# Patient Record
Sex: Male | Born: 1955 | Race: White | Hispanic: No | Marital: Married | Smoking: Never smoker
Health system: Southern US, Community
[De-identification: ages and names within clinical notes are randomized; demographics above are authoritative.]

## PROBLEM LIST (undated history)

## (undated) DIAGNOSIS — J189 Pneumonia, unspecified organism: Secondary | ICD-10-CM

## (undated) DIAGNOSIS — R0681 Apnea, not elsewhere classified: Secondary | ICD-10-CM

## (undated) DIAGNOSIS — T7840XA Allergy, unspecified, initial encounter: Secondary | ICD-10-CM

## (undated) DIAGNOSIS — T8859XA Other complications of anesthesia, initial encounter: Secondary | ICD-10-CM

## (undated) DIAGNOSIS — I341 Nonrheumatic mitral (valve) prolapse: Secondary | ICD-10-CM

## (undated) DIAGNOSIS — M1990 Unspecified osteoarthritis, unspecified site: Secondary | ICD-10-CM

## (undated) DIAGNOSIS — Z87442 Personal history of urinary calculi: Secondary | ICD-10-CM

## (undated) DIAGNOSIS — R011 Cardiac murmur, unspecified: Secondary | ICD-10-CM

## (undated) HISTORY — DX: Personal history of urinary calculi: Z87.442

## (undated) HISTORY — PX: RECTAL PROLAPSE REPAIR: SHX759

## (undated) HISTORY — DX: Cardiac murmur, unspecified: R01.1

## (undated) HISTORY — DX: Allergy, unspecified, initial encounter: T78.40XA

## (undated) HISTORY — PX: LEG SURGERY: SHX1003

## (undated) HISTORY — PX: HEMORROIDECTOMY: SUR656

## (undated) HISTORY — DX: Apnea, not elsewhere classified: R06.81

## (undated) HISTORY — PX: COLONOSCOPY: SHX174

---

## 2016-07-31 ENCOUNTER — Emergency Department (HOSPITAL_COMMUNITY): Payer: BLUE CROSS/BLUE SHIELD

## 2016-07-31 ENCOUNTER — Emergency Department (HOSPITAL_COMMUNITY)
Admission: EM | Admit: 2016-07-31 | Discharge: 2016-07-31 | Disposition: A | Discharge status: Home / Self Care | Payer: BLUE CROSS/BLUE SHIELD | Attending: Emergency Medicine | Admitting: Emergency Medicine

## 2016-07-31 ENCOUNTER — Encounter (HOSPITAL_COMMUNITY): Payer: Self-pay

## 2016-07-31 DIAGNOSIS — N200 Calculus of kidney: Secondary | ICD-10-CM | POA: Diagnosis not present

## 2016-07-31 DIAGNOSIS — R109 Unspecified abdominal pain: Secondary | ICD-10-CM | POA: Diagnosis present

## 2016-07-31 DIAGNOSIS — R10A2 Flank pain, left side: Secondary | ICD-10-CM

## 2016-07-31 LAB — URINALYSIS, ROUTINE W REFLEX MICROSCOPIC
BILIRUBIN URINE: NEGATIVE
Glucose, UA: NEGATIVE mg/dL
KETONES UR: NEGATIVE mg/dL
LEUKOCYTES UA: NEGATIVE
NITRITE: NEGATIVE
PH: 5.5 (ref 5.0–8.0)
PROTEIN: NEGATIVE mg/dL
Specific Gravity, Urine: 1.019 (ref 1.005–1.030)

## 2016-07-31 LAB — CBC WITH DIFFERENTIAL/PLATELET
BASOS PCT: 0 %
Basophils Absolute: 0 10*3/uL (ref 0.0–0.1)
EOS ABS: 0.2 10*3/uL (ref 0.0–0.7)
Eosinophils Relative: 2 %
HCT: 44.3 % (ref 39.0–52.0)
HEMOGLOBIN: 15.7 g/dL (ref 13.0–17.0)
Lymphocytes Relative: 54 %
Lymphs Abs: 3.9 10*3/uL (ref 0.7–4.0)
MCH: 31.4 pg (ref 26.0–34.0)
MCHC: 35.4 g/dL (ref 30.0–36.0)
MCV: 88.6 fL (ref 78.0–100.0)
MONOS PCT: 6 %
Monocytes Absolute: 0.5 10*3/uL (ref 0.1–1.0)
NEUTROS PCT: 38 %
Neutro Abs: 2.7 10*3/uL (ref 1.7–7.7)
Platelets: 155 10*3/uL (ref 150–400)
RBC: 5 MIL/uL (ref 4.22–5.81)
RDW: 12.9 % (ref 11.5–15.5)
WBC: 7.3 10*3/uL (ref 4.0–10.5)

## 2016-07-31 LAB — URINE MICROSCOPIC-ADD ON

## 2016-07-31 LAB — I-STAT CHEM 8, ED
BUN: 25 mg/dL — AB (ref 6–20)
Calcium, Ion: 1.22 mmol/L (ref 1.15–1.40)
Chloride: 104 mmol/L (ref 101–111)
Creatinine, Ser: 0.7 mg/dL (ref 0.61–1.24)
Glucose, Bld: 135 mg/dL — ABNORMAL HIGH (ref 65–99)
HEMATOCRIT: 45 % (ref 39.0–52.0)
Hemoglobin: 15.3 g/dL (ref 13.0–17.0)
Potassium: 3.7 mmol/L (ref 3.5–5.1)
SODIUM: 147 mmol/L — AB (ref 135–145)
TCO2: 30 mmol/L (ref 0–100)

## 2016-07-31 MED ORDER — TAMSULOSIN HCL 0.4 MG PO CAPS: 0.4000 mg | ORAL_CAPSULE | Freq: Every day | ORAL | 0 refills | Status: DC

## 2016-07-31 MED ORDER — METOCLOPRAMIDE HCL 5 MG/ML IJ SOLN
10.0000 mg | Freq: Once | INTRAMUSCULAR | Status: AC
  Administered 2016-07-31: 10 mg via INTRAVENOUS
  Filled 2016-07-31: qty 2

## 2016-07-31 MED ORDER — PROMETHAZINE HCL 25 MG/ML IJ SOLN
6.2500 mg | Freq: Once | INTRAMUSCULAR | Status: AC
  Administered 2016-07-31: 6.25 mg via INTRAVENOUS
  Filled 2016-07-31: qty 1

## 2016-07-31 MED ORDER — ONDANSETRON HCL 4 MG/2ML IJ SOLN
4.0000 mg | Freq: Once | INTRAMUSCULAR | Status: AC
  Administered 2016-07-31: 4 mg via INTRAVENOUS
  Filled 2016-07-31: qty 2

## 2016-07-31 MED ORDER — HYDROMORPHONE HCL 1 MG/ML IJ SOLN
1.0000 mg | Freq: Once | INTRAMUSCULAR | Status: AC
  Administered 2016-07-31: 1 mg via INTRAVENOUS
  Filled 2016-07-31: qty 1

## 2016-07-31 MED ORDER — ONDANSETRON 4 MG PO TBDP: 4.0000 mg | ORAL_TABLET | Freq: Three times a day (TID) | ORAL | 0 refills | Status: DC | PRN

## 2016-07-31 MED ORDER — KETOROLAC TROMETHAMINE 15 MG/ML IJ SOLN
30.0000 mg | Freq: Once | INTRAMUSCULAR | Status: AC
  Administered 2016-07-31: 30 mg via INTRAVENOUS
  Filled 2016-07-31: qty 2

## 2016-07-31 MED ORDER — MORPHINE SULFATE (PF) 4 MG/ML IV SOLN
4.0000 mg | Freq: Once | INTRAVENOUS | Status: AC
  Administered 2016-07-31: 4 mg via INTRAVENOUS
  Filled 2016-07-31: qty 1

## 2016-07-31 MED ORDER — FENTANYL CITRATE (PF) 100 MCG/2ML IJ SOLN
50.0000 ug | INTRAMUSCULAR | Status: AC | PRN
  Administered 2016-07-31 (×2): 50 ug via INTRAVENOUS
  Filled 2016-07-31 (×2): qty 2

## 2016-07-31 MED ORDER — KETOROLAC TROMETHAMINE 10 MG PO TABS: 10.0000 mg | ORAL_TABLET | Freq: Four times a day (QID) | ORAL | 0 refills | Status: DC | PRN

## 2016-07-31 MED ORDER — KETOROLAC TROMETHAMINE 15 MG/ML IJ SOLN: 15.0000 mg | Freq: Once | INTRAMUSCULAR | Status: DC

## 2016-07-31 MED ORDER — KETOROLAC TROMETHAMINE 30 MG/ML IJ SOLN
30.0000 mg | Freq: Once | INTRAMUSCULAR | Status: AC
  Administered 2016-07-31: 30 mg via INTRAVENOUS
  Filled 2016-07-31: qty 1

## 2016-07-31 NOTE — ED Notes (Signed)
Patient transported to CT 

## 2016-07-31 NOTE — ED Provider Notes (Signed)
[facility]-EMERGENCY DEPT Provider Note   CSN: [id number] Arrival date & time: 07/31/16  0301     History   Chief Complaint Chief Complaint  Patient presents with  . Flank Pain    HPI Randy Cruz is a 60 y.o. male.  Patient presents with sudden onset severe left flank pain with nausea and vomiting starting over night last night. The pain radiates to LLQ and left testicle.  No fever. No history of stones. He denies having similar symptoms in the past. No chest pain.    The history is provided by the patient and the spouse. No language interpreter was used.  Flank Pain  This is a new problem. The current episode started 1 to 2 hours ago. The problem occurs constantly. The problem has not changed since onset.Associated symptoms include abdominal pain. Pertinent negatives include no chest pain and no shortness of breath. Nothing aggravates the symptoms. Nothing relieves the symptoms.    History reviewed. No pertinent past medical history.  There are no active problems to display for this patient.   History reviewed. No pertinent surgical history.     Home Medications    Prior to Admission medications   Not on File    Family History History reviewed. No pertinent family history.  Social History Social History  Substance Use Topics  . Smoking status: Never Smoker  . Smokeless tobacco: Never Used  . Alcohol use No     Allergies   Demerol [meperidine]   Review of Systems Review of Systems  Constitutional: Negative for chills and fever.  Respiratory: Negative.  Negative for shortness of breath.   Cardiovascular: Negative.  Negative for chest pain.  Gastrointestinal: Positive for abdominal pain, nausea and vomiting.  Genitourinary: Positive for flank pain and testicular pain. Negative for difficulty urinating.  Skin: Negative.   Neurological: Negative.      Physical Exam Updated Vital Signs BP 132/84 (BP Location: Right Arm)   Pulse 82   Temp 97.8 F (36.6  C) (Oral)   Resp 18   Ht 5\' 9"  (1.753 m)   Wt 70.3 kg   SpO2 97%   BMI 22.89 kg/m   Physical Exam  Constitutional: He is oriented to person, place, and time. He appears well-developed and well-nourished.  Appears extremely uncomfortable, unable to sit still, actively vomiting.   Neck: Normal range of motion.  Pulmonary/Chest: Effort normal.  Abdominal: Soft. There is no tenderness.  Genitourinary:  Genitourinary Comments: Mild left CVA tenderness.   Musculoskeletal: Normal range of motion.  Neurological: He is alert and oriented to person, place, and time.  Skin: Skin is warm and dry.  Psychiatric: He has a normal mood and affect.     ED Treatments / Results  Labs (all labs ordered are listed, but only abnormal results are displayed) Labs Reviewed  CBC WITH DIFFERENTIAL/PLATELET  URINALYSIS, ROUTINE W REFLEX MICROSCOPIC (NOT [facility])  I-STAT CHEM 8, ED   Results for orders placed or performed during the hospital encounter of 07/31/16  CBC with Differential/Platelet  Result Value Ref Range   WBC 7.3 4.0 - 10.5 K/uL   RBC 5.00 4.22 - 5.81 MIL/uL   Hemoglobin 15.7 13.0 - 17.0 g/dL   HCT 40.944.3 81.139.0 - 91.452.0 %   MCV 88.6 78.0 - 100.0 fL   MCH 31.4 26.0 - 34.0 pg   MCHC 35.4 30.0 - 36.0 g/dL   RDW 78.212.9 95.611.5 - 21.315.5 %   Platelets 155 150 - 400 K/uL   Neutrophils Relative %  38 %   Neutro Abs 2.7 1.7 - 7.7 K/uL   Lymphocytes Relative 54 %   Lymphs Abs 3.9 0.7 - 4.0 K/uL   Monocytes Relative 6 %   Monocytes Absolute 0.5 0.1 - 1.0 K/uL   Eosinophils Relative 2 %   Eosinophils Absolute 0.2 0.0 - 0.7 K/uL   Basophils Relative 0 %   Basophils Absolute 0.0 0.0 - 0.1 K/uL   Ct Renal Stone Study  Result Date: 07/31/2016 CLINICAL DATA:  Awakened this morning by left flank pain and nausea. EXAM: CT ABDOMEN AND PELVIS WITHOUT CONTRAST TECHNIQUE: Multidetector CT imaging of the abdomen and pelvis was performed following the standard protocol without IV contrast. COMPARISON:  None  FINDINGS: There is an obstructing left ureteral calculus at the upper L4 level measuring 4 x 5 mm in cross-section and 7.5 mm in length. There is moderate hydronephrosis. No other urinary calculi are evident. No other acute findings are evident. There are unremarkable unenhanced appearances of the liver, gallbladder, bile ducts, pancreas, spleen, adrenals and right kidney. The urinary bladder is unremarkable. The stomach, small bowel and colon are unremarkable. The abdominal aorta is normal in caliber. There is no atherosclerotic calcification. There is no adenopathy in the abdomen or pelvis. No significant skeletal lesion. No significant abnormality in the lower chest. IMPRESSION: Obstructing 4 x 5 mm left ureteral calculus at the upper L4 level with moderate hydronephrosis. Electronically Signed   By: Ellery Plunk M.D.   On: 07/31/2016 04:53    EKG  EKG Interpretation None       Radiology Ct Renal Stone Study  Result Date: 07/31/2016 CLINICAL DATA:  Awakened this morning by left flank pain and nausea. EXAM: CT ABDOMEN AND PELVIS WITHOUT CONTRAST TECHNIQUE: Multidetector CT imaging of the abdomen and pelvis was performed following the standard protocol without IV contrast. COMPARISON:  None FINDINGS: There is an obstructing left ureteral calculus at the upper L4 level measuring 4 x 5 mm in cross-section and 7.5 mm in length. There is moderate hydronephrosis. No other urinary calculi are evident. No other acute findings are evident. There are unremarkable unenhanced appearances of the liver, gallbladder, bile ducts, pancreas, spleen, adrenals and right kidney. The urinary bladder is unremarkable. The stomach, small bowel and colon are unremarkable. The abdominal aorta is normal in caliber. There is no atherosclerotic calcification. There is no adenopathy in the abdomen or pelvis. No significant skeletal lesion. No significant abnormality in the lower chest. IMPRESSION: Obstructing 4 x 5 mm left  ureteral calculus at the upper L4 level with moderate hydronephrosis. Electronically Signed   By: Ellery Plunk M.D.   On: 07/31/2016 04:53    Procedures Procedures (including critical care time)  Medications Ordered in ED Medications  fentaNYL (SUBLIMAZE) injection 50 mcg (50 mcg Intravenous Given 07/31/16 0344)  ondansetron (ZOFRAN) injection 4 mg (4 mg Intravenous Given 07/31/16 0343)  metoCLOPramide (REGLAN) injection 10 mg (10 mg Intravenous Given 07/31/16 0419)  morphine 4 MG/ML injection 4 mg (4 mg Intravenous Given 07/31/16 0421)  HYDROmorphone (DILAUDID) injection 1 mg (1 mg Intravenous Given 07/31/16 0448)  ketorolac (TORADOL) 15 MG/ML injection 30 mg (30 mg Intravenous Given 07/31/16 0515)     Initial Impression / Assessment and Plan / ED Course  I have reviewed the triage vital signs and the nursing notes.  Pertinent labs & imaging results that were available during my care of the patient were reviewed by me and considered in my medical decision making (see chart for details).  Clinical Course    1. Left ureteral stone  The patient's pain is better controlled with Toradol rather than Morphine and Dilaudid given prior to Toradol. Nausea returned after PO challenge. Additional medication ordered.   Patient care signed out to [facility], PA-C, pending UA to r/o infection. PO challenge will need to be repeated given recurrent vomiting. If pain unmanaged, PO challenge fails, will likely need to consult Dr. Annabell Howells with urology.   Final Clinical Impressions(s) / ED Diagnoses   Final diagnoses:  None    New Prescriptions New Prescriptions   No medications on file     [place]ne [place] 07/31/16 1610    April Palumbo, MD 07/31/16 [phone]

## 2016-07-31 NOTE — ED Notes (Signed)
Pt still reporting pain 10/10 with no improvement in nausea. PA notified and order for Dilaudid 1mg  IVP given.

## 2016-07-31 NOTE — Discharge Instructions (Signed)
Follow up with urology for re-evaluation. Take zofran as needed for nausea. Take toradol as needed for pain. Do not exceed 40mg /day. Take Flomax daily. Return to the ED if you experience severe worsening of your symptoms, fevers, chills, continued vomiting, uncontrolled pain.

## 2016-07-31 NOTE — ED Notes (Signed)
Verbal order for Toradol 30mg  IVP by S.Upstill PA

## 2016-07-31 NOTE — ED Provider Notes (Signed)
Upon re-evaluation, pt still feeling nauseous. Given phenergan and additional toradol with significant improvement in symptoms. Pt now tolerating fluids in ED, >6oz. UA does not appear to bee infected, sent for culture. Will d/c with flomax, PO toradol per pt request and zofran. Referral to urology given. Return precautions outlined in patient discharge instructions.     Lester KinsmanSamantha Tripp PolandDowless, PA-C 07/31/16 1559    Cy BlamerApril Palumbo, MD 08/01/16 [phone]

## 2016-07-31 NOTE — ED Notes (Signed)
Pt having active vomiting at this time. PA notified.

## 2016-07-31 NOTE — ED Notes (Signed)
Pt given urine cup and instructed on need for urine sample 

## 2016-07-31 NOTE — ED Notes (Signed)
Pt currently drinking water.

## 2016-07-31 NOTE — ED Triage Notes (Signed)
Pt complains of left sided back pain that woke him up in the middle of the night, he also states that he's nauseated

## 2016-07-31 NOTE — ED Notes (Signed)
Verbal order for 10mg  Reglan IVP and 4mg  Morphine IVP by S.Upstill PA.

## 2016-08-01 ENCOUNTER — Inpatient Hospital Stay (HOSPITAL_COMMUNITY): Payer: BLUE CROSS/BLUE SHIELD | Admitting: Anesthesiology

## 2016-08-01 ENCOUNTER — Encounter (HOSPITAL_COMMUNITY): Payer: Self-pay | Admitting: *Deleted

## 2016-08-01 ENCOUNTER — Other Ambulatory Visit: Payer: Self-pay | Admitting: Urology

## 2016-08-01 ENCOUNTER — Ambulatory Visit (HOSPITAL_COMMUNITY)
Admission: AD | Admit: 2016-08-01 | Discharge: 2016-08-01 | Disposition: A | Discharge status: Home / Self Care | Payer: BLUE CROSS/BLUE SHIELD | Source: Ambulatory Visit | Attending: Urology | Admitting: Urology

## 2016-08-01 ENCOUNTER — Encounter (HOSPITAL_COMMUNITY)
Admission: AD | Disposition: A | Discharge status: Home / Self Care | Payer: Self-pay | Source: Ambulatory Visit | Attending: Urology

## 2016-08-01 DIAGNOSIS — N362 Urethral caruncle: Secondary | ICD-10-CM | POA: Insufficient documentation

## 2016-08-01 DIAGNOSIS — N201 Calculus of ureter: Secondary | ICD-10-CM | POA: Insufficient documentation

## 2016-08-01 DIAGNOSIS — N200 Calculus of kidney: Secondary | ICD-10-CM | POA: Diagnosis present

## 2016-08-01 HISTORY — DX: Unspecified osteoarthritis, unspecified site: M19.90

## 2016-08-01 HISTORY — DX: Pneumonia, unspecified organism: J18.9

## 2016-08-01 HISTORY — DX: Nonrheumatic mitral (valve) prolapse: I34.1

## 2016-08-01 HISTORY — PX: CYSTOSCOPY/RETROGRADE/URETEROSCOPY: SHX5316

## 2016-08-01 HISTORY — PX: HOLMIUM LASER APPLICATION: SHX5852

## 2016-08-01 LAB — URINE CULTURE: Culture: NO GROWTH

## 2016-08-01 SURGERY — CYSTOSCOPY/RETROGRADE/URETEROSCOPY
Anesthesia: General | Laterality: Left

## 2016-08-01 MED ORDER — FENTANYL CITRATE (PF) 100 MCG/2ML IJ SOLN
INTRAMUSCULAR | Status: DC | PRN
  Administered 2016-08-01 (×2): 50 ug via INTRAVENOUS

## 2016-08-01 MED ORDER — CEPHALEXIN 500 MG PO CAPS: 500.0000 mg | ORAL_CAPSULE | Freq: Two times a day (BID) | ORAL | 0 refills | Status: DC

## 2016-08-01 MED ORDER — HYDROMORPHONE HCL 1 MG/ML IJ SOLN: 0.2500 mg | INTRAMUSCULAR | Status: DC | PRN

## 2016-08-01 MED ORDER — PROPOFOL 10 MG/ML IV BOLUS
INTRAVENOUS | Status: AC
  Filled 2016-08-01: qty 20

## 2016-08-01 MED ORDER — OXYBUTYNIN CHLORIDE 5 MG PO TABS: 5.0000 mg | ORAL_TABLET | Freq: Three times a day (TID) | ORAL | 1 refills | Status: DC

## 2016-08-01 MED ORDER — FENTANYL CITRATE (PF) 100 MCG/2ML IJ SOLN
INTRAMUSCULAR | Status: AC
  Filled 2016-08-01: qty 2

## 2016-08-01 MED ORDER — CEFAZOLIN SODIUM-DEXTROSE 2-4 GM/100ML-% IV SOLN
2.0000 g | INTRAVENOUS | Status: AC
Start: 2016-08-01 — End: 2016-08-01
  Administered 2016-08-01: 2 g via INTRAVENOUS
  Filled 2016-08-01: qty 100

## 2016-08-01 MED ORDER — PROPOFOL 10 MG/ML IV BOLUS
INTRAVENOUS | Status: DC | PRN
  Administered 2016-08-01: 200 mg via INTRAVENOUS

## 2016-08-01 MED ORDER — MIDAZOLAM HCL 5 MG/5ML IJ SOLN
INTRAMUSCULAR | Status: DC | PRN
  Administered 2016-08-01: 1 mg via INTRAVENOUS

## 2016-08-01 MED ORDER — CEFAZOLIN SODIUM-DEXTROSE 2-4 GM/100ML-% IV SOLN
INTRAVENOUS | Status: AC
  Filled 2016-08-01: qty 100

## 2016-08-01 MED ORDER — MIDAZOLAM HCL 2 MG/2ML IJ SOLN
INTRAMUSCULAR | Status: AC
  Filled 2016-08-01: qty 2

## 2016-08-01 MED ORDER — DEXAMETHASONE SODIUM PHOSPHATE 10 MG/ML IJ SOLN
INTRAMUSCULAR | Status: DC | PRN
  Administered 2016-08-01: 10 mg via INTRAVENOUS

## 2016-08-01 MED ORDER — PROMETHAZINE HCL 25 MG/ML IJ SOLN: 6.2500 mg | INTRAMUSCULAR | Status: DC | PRN

## 2016-08-01 MED ORDER — ONDANSETRON HCL 4 MG/2ML IJ SOLN
INTRAMUSCULAR | Status: AC
  Filled 2016-08-01: qty 2

## 2016-08-01 MED ORDER — BELLADONNA ALKALOIDS-OPIUM 16.2-60 MG RE SUPP
RECTAL | Status: DC | PRN
  Administered 2016-08-01: 1 via RECTAL

## 2016-08-01 MED ORDER — LIDOCAINE HCL (CARDIAC) 20 MG/ML IV SOLN
INTRAVENOUS | Status: DC | PRN
  Administered 2016-08-01: 25 mg via INTRATRACHEAL
  Administered 2016-08-01: 75 mg via INTRAVENOUS

## 2016-08-01 MED ORDER — STERILE WATER FOR IRRIGATION IR SOLN
Status: DC | PRN
  Administered 2016-08-01: 1000 mL

## 2016-08-01 MED ORDER — ACETAMINOPHEN 10 MG/ML IV SOLN
INTRAVENOUS | Status: AC
  Filled 2016-08-01: qty 100

## 2016-08-01 MED ORDER — MEPERIDINE HCL 50 MG/ML IJ SOLN: 6.2500 mg | INTRAMUSCULAR | Status: DC | PRN

## 2016-08-01 MED ORDER — ACETAMINOPHEN 10 MG/ML IV SOLN
INTRAVENOUS | Status: DC | PRN
  Administered 2016-08-01: 1000 mg via INTRAVENOUS

## 2016-08-01 MED ORDER — IOHEXOL 300 MG/ML  SOLN
INTRAMUSCULAR | Status: DC | PRN
  Administered 2016-08-01: 20 mL via URETHRAL

## 2016-08-01 MED ORDER — SODIUM CHLORIDE 0.9 % IR SOLN
Status: DC | PRN
  Administered 2016-08-01: 3000 mL

## 2016-08-01 MED ORDER — ONDANSETRON HCL 4 MG/2ML IJ SOLN
INTRAMUSCULAR | Status: DC | PRN
  Administered 2016-08-01: 4 mg via INTRAVENOUS

## 2016-08-01 MED ORDER — DEXAMETHASONE SODIUM PHOSPHATE 10 MG/ML IJ SOLN
INTRAMUSCULAR | Status: AC
  Filled 2016-08-01: qty 1

## 2016-08-01 MED ORDER — PROPOFOL 500 MG/50ML IV EMUL
INTRAVENOUS | Status: DC | PRN
  Administered 2016-08-01: 125 ug/kg/min via INTRAVENOUS

## 2016-08-01 MED ORDER — BELLADONNA ALKALOIDS-OPIUM 16.2-60 MG RE SUPP
RECTAL | Status: AC
  Filled 2016-08-01: qty 1

## 2016-08-01 MED ORDER — LACTATED RINGERS IV SOLN
INTRAVENOUS | Status: DC | PRN
  Administered 2016-08-01 (×2): via INTRAVENOUS

## 2016-08-01 MED ORDER — SUCCINYLCHOLINE CHLORIDE 20 MG/ML IJ SOLN
INTRAMUSCULAR | Status: DC | PRN
  Administered 2016-08-01: 100 mg via INTRAVENOUS

## 2016-08-01 MED ORDER — LACTATED RINGERS IV SOLN: INTRAVENOUS | Status: DC

## 2016-08-01 SURGICAL SUPPLY — 22 items
BAG URO CATCHER STRL LF (MISCELLANEOUS) ×3 IMPLANT
BASKET LASER NITINOL 1.9FR (BASKET) IMPLANT
BASKET ZERO TIP NITINOL 2.4FR (BASKET) IMPLANT
CATH INTERMIT  6FR 70CM (CATHETERS) ×3 IMPLANT
CLOTH BEACON ORANGE TIMEOUT ST (SAFETY) ×3 IMPLANT
EXTRACTOR STONE NITINOL NGAGE (UROLOGICAL SUPPLIES) ×3 IMPLANT
FIBER LASER FLEXIVA 365 (UROLOGICAL SUPPLIES) ×3 IMPLANT
FIBER LASER TRAC TIP (UROLOGICAL SUPPLIES) IMPLANT
GLOVE BIOGEL M 8.0 STRL (GLOVE) ×9 IMPLANT
GOWN STRL REUS W/ TWL XL LVL3 (GOWN DISPOSABLE) IMPLANT
GOWN STRL REUS W/TWL LRG LVL3 (GOWN DISPOSABLE) ×6 IMPLANT
GOWN STRL REUS W/TWL XL LVL3 (GOWN DISPOSABLE)
GUIDEWIRE ANG ZIPWIRE 038X150 (WIRE) IMPLANT
GUIDEWIRE STR DUAL SENSOR (WIRE) ×3 IMPLANT
IV NS 1000ML (IV SOLUTION)
IV NS 1000ML BAXH (IV SOLUTION) IMPLANT
MANIFOLD NEPTUNE II (INSTRUMENTS) ×3 IMPLANT
PACK CYSTO (CUSTOM PROCEDURE TRAY) ×3 IMPLANT
SHEATH ACCESS URETERAL 38CM (SHEATH) ×3 IMPLANT
STENT CONTOUR 6FRX24X.038 (STENTS) ×3 IMPLANT
TUBING CONNECTING 10 (TUBING) ×2 IMPLANT
TUBING CONNECTING 10' (TUBING) ×1

## 2016-08-01 NOTE — Op Note (Signed)
Preoperative diagnosis: History of gross hematuria, left proximal ureteral stone  Postoperative diagnosis: History of gross hematuria, anterior urethral lesion/polyp, left ureteral stone, normal retrograde ureteropyelograms.  Principal procedure: Cystoscopy, biopsy of urethral lesion, bilateral retrograde ureteropyelograms with fluoroscopic interpretation, dilation of left ureter, left ureteroscopy with holmium laser lithotripsy and extraction of left ureteral stone, placement of 24 centimeter by 6 French contour double-J stent.  Surgeon: Retta Dionesahlstedt  Anesthesia: Gen. endotracheal  Complications: None  Specimen: Stone fragments, to the patient's wife  Drains: 24 centimeter by 6 French contour double-J stent with tether  Findings: 1 millimeter by 4 millimeter anterior urethral polyp, normal bladder, right retrograde ureteropyelogram revealed a normal right ureter without evidence of filling defects or hydronephrosis, normal pyelocalyceal system.  Left ureter and pyelocalyceal system normal except for a filling defect from left ureteral stone as well as forniceal extravasation of contrast.  Indications: 60 year old male with intractable pain from a proximal left ureteral stone.  The patient presented initially about 2 days ago to the emergency room, where his 4 by 5 millimeter left ureteral calculus was noted.  He was sent home after adequate pain management.  He presented for follow-up yesterday to see Dr. Marlou PorchHerrick.  At that time, he was fairly comfortable.  It was recommended that he undergo lithotripsy, which is scheduled for next week.  However, he presented today in and intractable pain, and despite Toradol, was significantly uncomfortable.  There was no evidence of stone progression.  I offered the patient continued pain management versus ureteroscopic management of his stone.  He has chosen the latter.  Risks and complications of this procedure have been discussed in depth with the patient and  his wife who desire to proceed.  Description of procedure: The patient was properly identified and marked in the holding area.  He was taken the operating room.  General endotracheal anesthetic was administered.  He was then placed in the dorsolithotomy position.  Genitalia and perineum were prepped and draped.  Proper timeout was performed.  His urethral meatus was dilated to 24 [place], as it would not admit the 21 French cystoscope.  Following this, the cystoscope was easily passed.  The urethra was normal except for the previously mentioned lesion on the right side of the mid anterior urethra.  It was felt this could be biopsied later, as he did have a history of hematuria, although doubtful from this lesion.  The scope was advanced through his prostatic urethra which was unremarkable.  The bladder was inspected circumferentially.  There are no tumors, trabeculations or foreign bodies.  Ureteral orifices were normal in configuration, but the location was fairly low down at the bladder neck.  The bladder showed no signs of lesions that would create hematuria.  The right ureter was cannulated with a 6 [place] open-ended catheter.  Retrograde ureteropyelogram was performed, with the above tension findings.  The left ureter was then cannulated with a 6 [place] open-ended catheter.  Again, retrograde pyelogram was performed.  This showed the obstructing left ureteral stone with slight proximal hydro-ureter nephrosis.  There was forniceal extravasation of the contrast, despite gentle administration of this contrast.  The sural 0.038 inch sensor-tip guidewire was advanced through the open-ended catheter, and up to the left upper pole calyces.  The open-ended catheter and the cystoscope were removed.  The ureter was sequentially dilated, first with the inner core and then with the entire 12/14 ureteral access catheter.  The access catheter was removed, the guidewire was left in place.  I  gently navigated the 6  French semirigid ureteroscope through the urethra, and then up into the bladder where the ureteral orifice was encountered.  I then passed the ureteroscope directly through the ureter and up into the stone.  It was felt that the stone was too large for extraction alone.  I then passed the 360 micron laser fiber through the scope, and applied laser energy at a power of 0.5 joules and a rate of 15 hertz.  This fragmented the stone into approximate 6-7 smaller fragments which were then easily grasped with the engage basket and extracted into the bladder.  There were then dropped into the bladder.  The scope was then passed through the ureter, up through the proximal ureter to the UPJ.  No further fragments were seen upon close inspection.  The scope was then removed.  I then backloaded the guidewire through the scope, and passed a 24 centimeter by 6 [place] contour double-J stent, with the string left intact, up through the ureter, positioning it adequately using fluoroscopic and cystoscopic guidance.  Excellent proximal and distal curls were seen.  Once the guidewire was removed.  The string was left on and brought through the urethra.  I then placed.  The cold cup biopsy forceps on the scope, and biopsy the small urethral polyp.  This was then cauterized.  The specimen was sent to pathology labeled "urethral lesion,".  There was no significant bleeding from the biopsy site.  At this point, the bladder was drained.  The scope was removed.  The string was taped to the patient's penis.  After it was shortened somewhat.  The patient was then awakened and taken to PACU in stable condition.  He tolerated the procedure well.

## 2016-08-01 NOTE — Anesthesia Preprocedure Evaluation (Addendum)
Anesthesia Evaluation  Patient identified by MRN, date of birth, ID band Patient awake    Reviewed: Allergy & Precautions, NPO status , Patient's Chart, lab work & pertinent test results  Airway Mallampati: III   Neck ROM: Full  Mouth opening: Limited Mouth Opening  Dental  (+) Teeth Intact, Dental Advisory Given   Pulmonary neg pulmonary ROS,    breath sounds clear to auscultation       Cardiovascular negative cardio ROS   Rhythm:Regular Rate:Normal     Neuro/Psych negative neurological ROS  negative psych ROS   GI/Hepatic negative GI ROS, Neg liver ROS,   Endo/Other  negative endocrine ROS  Renal/GU negative Renal ROS  negative genitourinary   Musculoskeletal negative musculoskeletal ROS (+)   Abdominal (+)  Abdomen: tender.    Peds negative pediatric ROS (+)  Hematology negative hematology ROS (+)   Anesthesia Other Findings   Reproductive/Obstetrics negative OB ROS                            Anesthesia Physical Anesthesia Plan  ASA: II and emergent  Anesthesia Plan: General   Post-op Pain Management:    Induction: Intravenous, Rapid sequence and Cricoid pressure planned  Airway Management Planned: Oral ETT and Video Laryngoscope Planned  Additional Equipment:   Intra-op Plan:   Post-operative Plan: Extubation in OR  Informed Consent: I have reviewed the patients History and Physical, chart, labs and discussed the procedure including the risks, benefits and alternatives for the proposed anesthesia with the patient or authorized representative who has indicated his/her understanding and acceptance.     Plan Discussed with: CRNA  Anesthesia Plan Comments:         Anesthesia Quick Evaluation

## 2016-08-01 NOTE — Discharge Instructions (Signed)

## 2016-08-01 NOTE — Transfer of Care (Signed)
Immediate Anesthesia Transfer of Care Note  Patient: Randy Cruz  Procedure(s) Performed: Procedure(s): CYSTO/BILATERA L RETROGRADELEFT /URETEROSCOPY/STONE REMOVAL WITH BASKET/LEFT URETERAL STENT /URETRHAL BIOPSY (Left) HOLMIUM LASER APPLICATION (Left)  Patient Location: PACU  Anesthesia Type:General  Level of Consciousness: awake, alert , oriented and patient cooperative  Airway & Oxygen Therapy: Patient Spontanous Breathing and Patient connected to face mask oxygen  Post-op Assessment: Report given to RN, Post -op Vital signs reviewed and stable and Patient moving all extremities X 4  Post vital signs: stable  Last Vitals:  Vitals:   08/01/16 2126 08/01/16 2130  BP: 115/75   Pulse: 67 73  Resp: 14 14  Temp: 36.7 C     Last Pain:  Vitals:   08/01/16 1837  PainSc: 5       Patients Stated Pain Goal: 3 (08/01/16 1837)  Complications: No apparent anesthesia complications

## 2016-08-01 NOTE — Anesthesia Procedure Notes (Signed)
Procedure Name: Intubation Date/Time: 08/01/2016 8:17 PM Performed by: Illene SilverEVANS, Georgeann Brinkman E Pre-anesthesia Checklist: Patient identified, Emergency Drugs available, Suction available and Patient being monitored Patient Re-evaluated:Patient Re-evaluated prior to inductionOxygen Delivery Method: Circle system utilized Preoxygenation: Pre-oxygenation with 100% oxygen Intubation Type: IV induction Ventilation: Mask ventilation without difficulty Grade View: Grade III Tube type: Oral Tube size: 7.5 mm Number of attempts: 1 Airway Equipment and Method: Stylet and Oral airway Placement Confirmation: ETT inserted through vocal cords under direct vision,  positive ETCO2 and breath sounds checked- equal and bilateral Secured at: 22 cm Tube secured with: Tape Dental Injury: Teeth and Oropharynx as per pre-operative assessment  Difficulty Due To: Difficulty was anticipated, Difficult Airway- due to anterior larynx and Difficult Airway- due to limited oral opening Future Recommendations: Recommend- induction with short-acting agent, and alternative techniques readily available Comments: Very small mouth , anterior larynx

## 2016-08-01 NOTE — H&P (Signed)
Urology History and Physical Exam  CC: kidney stone  HPI: 60 year old male presented to our office today, but the second time in 24 hours with a symptomatic left proximal ureteral stone.  He presented to the emergency room about 36 hours ago with similar left flank pain, nausea and vomiting.  The patient was made comfortable, although he did have a problem with fentanyl a reaction to that.  He began having significant flank pain at 3 PM this afternoon.  That included nausea.  The patient presented to the office in significant pain.  He was given Toradol.  The patient had his pain resolved moderately well, and KUB revealed persistence of his left proximal ureteral stone at about the L4 transverse process.  Because of the pain, its significant recurrence, as well as relative nonprogression of this small-to-moderate stone, it was recommended that he either increase his narcotic use or consider ureteroscopy today to ameliorate his symptoms.  The patient has decided to proceed withureteroscopy.  PMH: No past medical history on file.  PSH: No past surgical history on file.  Allergies: Allergies  Allergen Reactions  . Demerol [Meperidine] Nausea And Vomiting    Medications: No prescriptions prior to admission.     Social History: Social History   Social History  . Marital status: Married    Spouse name: N/A  . Number of children: N/A  . Years of education: N/A   Occupational History  . Not on file.   Social History Main Topics  . Smoking status: Never Smoker  . Smokeless tobacco: Never Used  . Alcohol use No  . Drug use: Unknown  . Sexual activity: Not on file   Other Topics Concern  . Not on file   Social History Narrative  . No narrative on file    Family History: No family history on file.  Review of Systems: Positive:  Intermittent gross hematuria for several years, left flank pain, nausea Negative:  A further 10 point review of systems was negative except what is  listed in the HPI.                  Physical Exam: @VITALS2 @ General: Originally in moderate to severe distress.  Awake. Head:  Normocephalic.  Atraumatic. ENT:  EOMI.  Mucous membranes moist Neck:  Supple.  No lymphadenopathy. CV:  S1 present. S2 present. Regular rate. Pulmonary: Equal effort bilaterally.  Clear to auscultation bilaterally. Abdomen: Soft.  Left CVA tenderness Skin:  Normal turgor.  No visible rash. Extremity: No gross deformity of bilateral upper extremities.  No gross deformity of                             lower extremities. Neurologic: Alert. Appropriate mood.    Studies:  Recent Labs     07/31/16  0335  07/31/16  0355  HGB  15.7  15.3  WBC  7.3   --   PLT  155   --     Recent Labs     07/31/16  0355  NA  147*  K  3.7  CL  104  BUN  25*  CREATININE  0.70   the patient had a KUB today.  This reveals a 4 mm calcification of the left L4 transverse process.  We reviewed his prior CT images.  No results for input(s): INR, APTT in the last 72 hours.  Invalid input(s): PT   Invalid input(s): ABG  Assessment:  Significantly symptomatic, not progressing left proximal ureteral stone  Plan: The patient would like to proceed with left ureteroscopy and laser of the stone.  He is aware of the risk and complications have been discussed with him.  He is also aware of the option of considering/continuing medical expulsive therapy in preparation for lithotripsy next week.  He and his wife desire to proceed.  He has had a sandwich about 12:30 PM, as well as, about 3 PM, half of an [place] ice.

## 2016-08-01 NOTE — Anesthesia Postprocedure Evaluation (Signed)
Anesthesia Post Note  Patient: Randy Cruz  Procedure(s) Performed: Procedure(s) (LRB): CYSTO/BILATERA L RETROGRADELEFT /URETEROSCOPY/STONE REMOVAL WITH BASKET/LEFT URETERAL STENT /URETRHAL BIOPSY (Left) HOLMIUM LASER APPLICATION (Left)  Patient location during evaluation: PACU Anesthesia Type: General Level of consciousness: awake and alert Pain management: pain level controlled Vital Signs Assessment: post-procedure vital signs reviewed and stable Respiratory status: spontaneous breathing, nonlabored ventilation, respiratory function stable and patient connected to nasal cannula oxygen Cardiovascular status: blood pressure returned to baseline and stable Postop Assessment: no signs of nausea or vomiting Anesthetic complications: no    Last Vitals:  Vitals:   08/01/16 2126 08/01/16 2130  BP: 115/75   Pulse: 67 73  Resp: 14 14  Temp: 36.7 C     Last Pain:  Vitals:   08/01/16 1837  PainSc: 5                  Shelton SilvasKevin D Odette Watanabe

## 2016-08-02 ENCOUNTER — Encounter (HOSPITAL_COMMUNITY): Payer: Self-pay | Admitting: Urology

## 2016-08-07 ENCOUNTER — Encounter (HOSPITAL_COMMUNITY): Payer: Self-pay | Admitting: Urology

## 2016-08-09 ENCOUNTER — Ambulatory Visit (HOSPITAL_COMMUNITY): Admit: 2016-08-09 | Payer: BLUE CROSS/BLUE SHIELD | Admitting: Urology

## 2016-08-09 ENCOUNTER — Encounter (HOSPITAL_COMMUNITY): Payer: Self-pay

## 2016-08-09 SURGERY — LITHOTRIPSY, ESWL
Anesthesia: LOCAL | Laterality: Left

## 2017-01-01 ENCOUNTER — Other Ambulatory Visit: Payer: Self-pay | Admitting: Registered Nurse

## 2017-01-01 ENCOUNTER — Ambulatory Visit
Admission: RE | Admit: 2017-01-01 | Discharge: 2017-01-01 | Disposition: A | Discharge status: Home / Self Care | Payer: BLUE CROSS/BLUE SHIELD | Source: Ambulatory Visit | Attending: Registered Nurse | Admitting: Registered Nurse

## 2017-01-01 DIAGNOSIS — M25562 Pain in left knee: Secondary | ICD-10-CM | POA: Diagnosis not present

## 2017-01-01 DIAGNOSIS — M25561 Pain in right knee: Secondary | ICD-10-CM | POA: Insufficient documentation

## 2017-01-01 DIAGNOSIS — R52 Pain, unspecified: Secondary | ICD-10-CM

## 2017-01-09 DIAGNOSIS — M2042 Other hammer toe(s) (acquired), left foot: Secondary | ICD-10-CM | POA: Diagnosis not present

## 2017-01-09 DIAGNOSIS — M6740 Ganglion, unspecified site: Secondary | ICD-10-CM | POA: Diagnosis not present

## 2017-03-19 ENCOUNTER — Ambulatory Visit: Payer: Self-pay | Admitting: Medical

## 2017-03-19 ENCOUNTER — Encounter: Payer: Self-pay | Admitting: Medical

## 2017-03-19 VITALS — BP 125/80 | HR 61 | Temp 96.9°F | Resp 16 | Ht 69.0 in | Wt 165.0 lb

## 2017-03-19 DIAGNOSIS — M79675 Pain in left toe(s): Secondary | ICD-10-CM
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
## 2017-04-01 ENCOUNTER — Ambulatory Visit: Payer: BLUE CROSS/BLUE SHIELD | Admitting: Podiatry

## 2017-04-02 ENCOUNTER — Encounter: Payer: Self-pay | Admitting: Podiatry

## 2017-04-02 ENCOUNTER — Ambulatory Visit (INDEPENDENT_AMBULATORY_CARE_PROVIDER_SITE_OTHER): Payer: BLUE CROSS/BLUE SHIELD | Admitting: Podiatry

## 2017-04-02 ENCOUNTER — Ambulatory Visit (INDEPENDENT_AMBULATORY_CARE_PROVIDER_SITE_OTHER): Payer: BLUE CROSS/BLUE SHIELD

## 2017-04-02 DIAGNOSIS — M2042 Other hammer toe(s) (acquired), left foot: Secondary | ICD-10-CM | POA: Diagnosis not present

## 2017-04-02 DIAGNOSIS — M205X2 Other deformities of toe(s) (acquired), left foot: Secondary | ICD-10-CM

## 2017-04-02 DIAGNOSIS — M6740 Ganglion, unspecified site: Secondary | ICD-10-CM
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
## 2017-04-09 ENCOUNTER — Other Ambulatory Visit: Payer: BLUE CROSS/BLUE SHIELD | Admitting: *Deleted

## 2017-04-09 DIAGNOSIS — Z0000 Encounter for general adult medical examination without abnormal findings: Secondary | ICD-10-CM

## 2017-04-09 LAB — POCT URINALYSIS DIPSTICK
BILIRUBIN UA: NEGATIVE
Blood, UA: NEGATIVE
Glucose, UA: NEGATIVE
KETONES UA: NEGATIVE
LEUKOCYTES UA: NEGATIVE
Nitrite, UA: NEGATIVE
PH UA: 6 (ref 5.0–8.0)
PROTEIN UA: NEGATIVE
Spec Grav, UA: 1.025 (ref 1.010–1.025)
Urobilinogen, UA: 0.2 E.U./dL

## 2017-04-10 LAB — CMP12+LP+TP+TSH+6AC+PSA+CBC…
A/G RATIO: 2 (ref 1.2–2.2)
ALBUMIN: 4.5 g/dL (ref 3.5–5.5)
ALT: 22 IU/L (ref 0–44)
AST: 24 IU/L (ref 0–40)
Alkaline Phosphatase: 69 IU/L (ref 39–117)
BASOS: 0 %
BILIRUBIN TOTAL: 2.1 mg/dL — AB (ref 0.0–1.2)
BUN / CREAT RATIO: 25 — AB (ref 9–20)
BUN: 19 mg/dL (ref 6–24)
Basophils Absolute: 0 10*3/uL (ref 0.0–0.2)
CALCIUM: 8.7 mg/dL (ref 8.7–10.2)
CHLORIDE: 105 mmol/L (ref 96–106)
CREATININE: 0.77 mg/dL (ref 0.76–1.27)
Chol/HDL Ratio: 3.5 ratio (ref 0.0–5.0)
Cholesterol, Total: 190 mg/dL (ref 100–199)
EOS (ABSOLUTE): 0.2 10*3/uL (ref 0.0–0.4)
EOS: 4 %
Estimated CHD Risk: 0.5 times avg. (ref 0.0–1.0)
Free Thyroxine Index: 1.9 (ref 1.2–4.9)
GFR, EST AFRICAN AMERICAN: 116 mL/min/{1.73_m2} (ref >59)
GFR, EST NON AFRICAN AMERICAN: 100 mL/min/{1.73_m2} (ref >59)
GGT: 32 IU/L (ref 0–65)
Globulin, Total: 2.2 g/dL (ref 1.5–4.5)
Glucose: 122 mg/dL — ABNORMAL HIGH (ref 65–99)
HDL: 55 mg/dL (ref >39)
HEMOGLOBIN: 15.5 g/dL (ref 13.0–17.7)
Hematocrit: 45.5 % (ref 37.5–51.0)
IMMATURE GRANS (ABS): 0 10*3/uL (ref 0.0–0.1)
Immature Granulocytes: 0 %
Iron: 104 ug/dL (ref 38–169)
LDH: 162 IU/L (ref 121–224)
LDL Calculated: 120 mg/dL — ABNORMAL HIGH (ref 0–99)
LYMPHS: 46 %
Lymphocytes Absolute: 2 10*3/uL (ref 0.7–3.1)
MCH: 30.6 pg (ref 26.6–33.0)
MCHC: 34.1 g/dL (ref 31.5–35.7)
MCV: 90 fL (ref 79–97)
MONOCYTES: 7 %
Monocytes Absolute: 0.3 10*3/uL (ref 0.1–0.9)
Neutrophils Absolute: 1.9 10*3/uL (ref 1.4–7.0)
Neutrophils: 43 %
PHOSPHORUS: 2.6 mg/dL (ref 2.5–4.5)
POTASSIUM: 4.6 mmol/L (ref 3.5–5.2)
Platelets: 132 10*3/uL — ABNORMAL LOW (ref 150–379)
Prostate Specific Ag, Serum: 2.9 ng/mL (ref 0.0–4.0)
RBC: 5.07 x10E6/uL (ref 4.14–5.80)
RDW: 14.2 % (ref 12.3–15.4)
SODIUM: 145 mmol/L — AB (ref 134–144)
T3 Uptake Ratio: 29 % (ref 24–39)
T4, Total: 6.7 ug/dL (ref 4.5–12.0)
TOTAL PROTEIN: 6.7 g/dL (ref 6.0–8.5)
TSH: 4.21 u[IU]/mL (ref 0.450–4.500)
Triglycerides: 73 mg/dL (ref 0–149)
URIC ACID: 6.2 mg/dL (ref 3.7–8.6)
VLDL CHOLESTEROL CAL: 15 mg/dL (ref 5–40)
WBC: 4.4 10*3/uL (ref 3.4–10.8)

## 2017-04-10 LAB — VITAMIN D 25 HYDROXY (VIT D DEFICIENCY, FRACTURES): Vit D, 25-Hydroxy: 44.4 ng/mL (ref 30.0–100.0)

## 2017-04-11 LAB — SPECIMEN STATUS REPORT

## 2017-04-11 LAB — HGB A1C W/O EAG: Hgb A1c MFr Bld: 5.7 % — ABNORMAL HIGH (ref 4.8–5.6)

## 2017-04-17 ENCOUNTER — Ambulatory Visit: Payer: BLUE CROSS/BLUE SHIELD | Admitting: Medical

## 2017-04-17 DIAGNOSIS — R7309 Other abnormal glucose: Secondary | ICD-10-CM

## 2017-04-17 DIAGNOSIS — B351 Tinea unguium: Secondary | ICD-10-CM

## 2017-04-17 DIAGNOSIS — I341 Nonrheumatic mitral (valve) prolapse: Secondary | ICD-10-CM

## 2017-04-17 DIAGNOSIS — E7800 Pure hypercholesterolemia, unspecified: Secondary | ICD-10-CM

## 2017-04-17 DIAGNOSIS — Z0000 Encounter for general adult medical examination without abnormal findings: Secondary | ICD-10-CM

## 2017-04-17 DIAGNOSIS — R011 Cardiac murmur, unspecified: Secondary | ICD-10-CM

## 2017-04-17 DIAGNOSIS — R7303 Prediabetes: Secondary | ICD-10-CM
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
## 2017-05-07 ENCOUNTER — Encounter: Payer: Self-pay | Admitting: Medical

## 2017-05-07 ENCOUNTER — Ambulatory Visit: Payer: BLUE CROSS/BLUE SHIELD | Admitting: Medical

## 2017-05-07 VITALS — BP 150/80 | HR 79 | Temp 97.9°F | Resp 16

## 2017-05-07 DIAGNOSIS — K0889 Other specified disorders of teeth and supporting structures: Secondary | ICD-10-CM

## 2017-05-07 MED ORDER — AMOXICILLIN 875 MG PO TABS: 875.0000 mg | ORAL_TABLET | Freq: Two times a day (BID) | ORAL | 0 refills | Status: DC

## 2017-05-07 NOTE — Progress Notes (Signed)
   Subjective:    Patient ID: Randy Cruz, male    DOB: Nov 08, 1956, 61 y.o.   MRN: [id number]  HPI 61 yo male 4 day history of  discharge from upper left molar and painful if chewing or not chewing. Taking nothing for pain. Denies swelling of face or redness to face. No fevers. Complains  of a bad discharge coming from area.   Review of Systems  Constitutional: Negative for chills and fatigue.  HENT: Positive for dental problem. Negative for rhinorrhea and sore throat.   Eyes: Negative for discharge and itching.  Respiratory: Negative for cough and shortness of breath.   Cardiovascular: Negative for chest pain.  Gastrointestinal: Negative for diarrhea, nausea and vomiting.  Endocrine: Negative for polydipsia, polyphagia and polyuria.  Genitourinary: Negative for hematuria.  Musculoskeletal: Negative for myalgias.  Skin: Negative for rash.  Allergic/Immunologic: Positive for environmental allergies. Negative for food allergies.  Neurological: Negative for dizziness and syncope.  Hematological: Negative for adenopathy.  Psychiatric/Behavioral: Negative for confusion and hallucinations.       Objective:   Physical Exam  Constitutional: He is oriented to person, place, and time. He appears well-developed and well-nourished.  HENT:  Head: Normocephalic and atraumatic.  Right Ear: External ear normal.  Left Ear: External ear normal.  Nose: Nose normal.  Mouth/Throat: Oropharynx is clear and moist.  Eyes: EOM are normal. Pupils are equal, round, and reactive to light.  Neck: Normal range of motion. Neck supple.  Cardiovascular: Normal rate and regular rhythm.   Murmur heard. Pulmonary/Chest: Effort normal and breath sounds normal.  Musculoskeletal: Normal range of motion.  Lymphadenopathy:    He has no cervical adenopathy.  Neurological: He is alert and oriented to person, place, and time.  Skin: Skin is warm and dry.  Psychiatric: He has a normal mood and affect. His behavior  is normal.  Nursing note and vitals reviewed.    No facial swelling , no facial redness. No abcess noted around molar on gumline     Assessment & Plan:  Toothache left upper molar  E-prescribed Amoxil 875 mg  One tablet  by mouth twice a a day times seven days, take with food. #14 no refills.  Avoid chewing on left side to avoid pain.  OTC Motrin or Tylenol as needed for pain, take as directed. Return to the clinic if redness, increased pain or swelling  to the face occur.

## 2017-05-07 NOTE — Patient Instructions (Addendum)
Toothache left upper molar  E-prescribed Amoxil 875 mg  One tablet  by mouth twice a a day x 7 days take with food #14 no refills.  Avoid chewing on left side to avoid pain.  OTC Motrin or Tylenol as needed for pain, take as directed. Return to the clinic if redness, increased pain or swelling  to the face occur.
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
## 2017-05-22 ENCOUNTER — Ambulatory Visit (INDEPENDENT_AMBULATORY_CARE_PROVIDER_SITE_OTHER): Payer: BLUE CROSS/BLUE SHIELD | Admitting: Cardiovascular Disease

## 2017-05-22 ENCOUNTER — Encounter: Payer: Self-pay | Admitting: Cardiovascular Disease

## 2017-05-22 VITALS — BP 148/86 | HR 60 | Ht 69.0 in | Wt 157.0 lb

## 2017-05-22 DIAGNOSIS — R011 Cardiac murmur, unspecified: Secondary | ICD-10-CM

## 2017-05-22 DIAGNOSIS — Z9889 Other specified postprocedural states: Secondary | ICD-10-CM | POA: Diagnosis not present

## 2017-05-22 NOTE — Patient Instructions (Addendum)

## 2017-05-23 ENCOUNTER — Telehealth: Payer: Self-pay

## 2017-05-23 NOTE — Telephone Encounter (Signed)
Left message for patient to call back. Patient wanted to know who he could go see for his finger nail bed issues. Per Dr. Eden EmmsNishan, patient [facility] or [facility]. Will inform patient of recommendations when he returns call.

## 2017-05-24 ENCOUNTER — Telehealth: Payer: Self-pay | Admitting: Podiatry

## 2017-05-24 NOTE — Telephone Encounter (Signed)
Called patient's wife [facility] back about Dr. Fabio BeringNishan's recommendations. Patient's wife verbalized understanding.
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
## 2017-05-24 NOTE — Telephone Encounter (Signed)
Pt voiced returning call

## 2017-06-02 HISTORY — PX: TRANSTHORACIC ECHOCARDIOGRAM: SHX275

## 2017-06-03 ENCOUNTER — Encounter: Payer: Self-pay | Admitting: Medical

## 2017-06-07 ENCOUNTER — Ambulatory Visit (HOSPITAL_COMMUNITY): Payer: BLUE CROSS/BLUE SHIELD | Attending: Cardiovascular Disease

## 2017-06-07 ENCOUNTER — Other Ambulatory Visit: Payer: Self-pay

## 2017-06-07 DIAGNOSIS — Z9889 Other specified postprocedural states: Secondary | ICD-10-CM

## 2017-06-07 DIAGNOSIS — I059 Rheumatic mitral valve disease, unspecified: Secondary | ICD-10-CM | POA: Insufficient documentation

## 2017-06-07 DIAGNOSIS — I341 Nonrheumatic mitral (valve) prolapse: Secondary | ICD-10-CM | POA: Insufficient documentation

## 2017-06-17 LAB — ECHOCARDIOGRAM COMPLETE
CHL CUP TV REG PEAK VELOCITY: 197 cm/s
E decel time: 227 msec
EERAT: 6.71
FS: 30 % (ref 28–44)
IVS/LV PW RATIO, ED: 1
LA ID, A-P, ES: 38 mm
LA diam end sys: 38 mm
LA diam index: 2.04 cm/m2
LA vol A4C: 58.9 ml
LV PW d: 10 mm — AB (ref 0.6–1.1)
LV TDI E'LATERAL: 11.6
LV TDI E'MEDIAL: 8.62
LV e' LATERAL: 11.6 cm/s
LVEEAVG: 6.71
LVEEMED: 6.71
LVOT SV: 59 mL
LVOT VTI: 17.1 cm
LVOT area: 3.46 cm2
LVOT diameter: 21 mm
LVOTPV: 85.1 cm/s
MV Dec: 227
MV pk A vel: 43.2 m/s
MV pk E vel: 77.8 m/s
MVAP: 3.33 cm2
MVPG: 2 mmHg
MVSPHT: 66 ms
PV Reg vel dias: 93 cm/s
RV LATERAL S' VELOCITY: 10.7 cm/s
RV TAPSE: 31.1 mm
RV sys press: 19 mmHg
TR max vel: 197 cm/s

## 2017-07-16 ENCOUNTER — Ambulatory Visit: Payer: BLUE CROSS/BLUE SHIELD | Admitting: Podiatry

## 2017-07-26 ENCOUNTER — Ambulatory Visit: Payer: BLUE CROSS/BLUE SHIELD | Admitting: Cardiovascular Disease

## 2017-08-09 ENCOUNTER — Other Ambulatory Visit: Payer: BLUE CROSS/BLUE SHIELD

## 2017-08-09 DIAGNOSIS — Z0000 Encounter for general adult medical examination without abnormal findings: Secondary | ICD-10-CM

## 2017-08-09 DIAGNOSIS — D696 Thrombocytopenia, unspecified: Secondary | ICD-10-CM

## 2017-08-10 LAB — PLATELET COUNT: PLATELETS: 117 10*3/uL — AB (ref 150–379)

## 2017-08-10 LAB — HEPATITIS C ANTIBODY (REFLEX)

## 2017-08-10 LAB — HCV COMMENT:

## 2017-08-10 LAB — BILIRUBIN, DIRECT: BILIRUBIN, DIRECT: 0.38 mg/dL (ref 0.00–0.40)

## 2017-08-20 ENCOUNTER — Telehealth: Payer: Self-pay

## 2017-08-20 NOTE — Telephone Encounter (Signed)
Discussed negative HIV and Hep C test. Bilirubin back to normal but platelets still a little low. Will schedule appt in 2 months for repeat CBC Randy Cruz

## 2017-10-21 ENCOUNTER — Other Ambulatory Visit: Payer: BLUE CROSS/BLUE SHIELD

## 2017-10-21 DIAGNOSIS — Z0000 Encounter for general adult medical examination without abnormal findings: Secondary | ICD-10-CM

## 2017-10-22 ENCOUNTER — Telehealth: Payer: Self-pay | Admitting: Medical

## 2017-10-22 DIAGNOSIS — Z0000 Encounter for general adult medical examination without abnormal findings: Secondary | ICD-10-CM

## 2017-10-22 LAB — CBC WITH DIFFERENTIAL/PLATELET
BASOS ABS: 0 10*3/uL (ref 0.0–0.2)
BASOS: 0 %
EOS (ABSOLUTE): 0.1 10*3/uL (ref 0.0–0.4)
EOS: 2 %
HEMATOCRIT: 44.9 % (ref 37.5–51.0)
HEMOGLOBIN: 14.9 g/dL (ref 13.0–17.7)
Immature Grans (Abs): 0 10*3/uL (ref 0.0–0.1)
Immature Granulocytes: 0 %
LYMPHS ABS: 2.6 10*3/uL (ref 0.7–3.1)
Lymphs: 48 %
MCH: 30.8 pg (ref 26.6–33.0)
MCHC: 33.2 g/dL (ref 31.5–35.7)
MCV: 93 fL (ref 79–97)
MONOCYTES: 5 %
Monocytes Absolute: 0.3 10*3/uL (ref 0.1–0.9)
NEUTROS ABS: 2.5 10*3/uL (ref 1.4–7.0)
Neutrophils: 45 %
Platelets: 127 10*3/uL — ABNORMAL LOW (ref 150–379)
RBC: 4.84 x10E6/uL (ref 4.14–5.80)
RDW: 13.9 % (ref 12.3–15.4)
WBC: 5.5 10*3/uL (ref 3.4–10.8)

## 2017-10-22 NOTE — Telephone Encounter (Signed)
Called patient to review platelets improved form  117 to  127.  Reviewed with Dr. Sullivan LoneGilbert and he recommended  6 month recheck.  Will do complete lab exec male panel.  Patient also complains of pain in the arm where he received flu vaccine a bout one month ago, denies infection, however certain movements like lifting it over his head or putting on a jacket can cause pain. Not all of the time just some of the time. Seems to be pinpoint where he go the injection per patient. Offered to see patient in clinic to evaluate.  He says it is not that bad and in fact when I suggested some OTC Tylenol he felt he did not need anything. He wanted to know if there was anything else he could do and I recommended icing the area. He says if it continues to bother him he will have it checked during his 6 months labs. Reviewed with patient that he may come in at any time to be evaluated.   Future labs placed in Epic

## 2017-10-23 ENCOUNTER — Other Ambulatory Visit: Payer: BLUE CROSS/BLUE SHIELD

## 2017-11-01 ENCOUNTER — Telehealth: Payer: Self-pay

## 2017-11-01 NOTE — Telephone Encounter (Signed)
Wife called in stating her husband had the flu shot a month ago and his arm was still sore and affecting his sleep; states they read something online called SRIVA and wanted to be seen; encouraged to make appt and discuss with provider; denies any other symptoms; appt made for Monday; wife verb u/o

## 2017-11-04 ENCOUNTER — Ambulatory Visit: Payer: BLUE CROSS/BLUE SHIELD | Admitting: Medical

## 2017-11-04 ENCOUNTER — Encounter: Payer: Self-pay | Admitting: Medical

## 2017-11-04 VITALS — BP 138/80 | HR 78 | Temp 97.4°F | Resp 16

## 2017-11-04 DIAGNOSIS — M25512 Pain in left shoulder: Secondary | ICD-10-CM

## 2017-11-04 MED ORDER — PREDNISONE 10 MG (21) PO TBPK: ORAL_TABLET | ORAL | 0 refills | Status: DC

## 2017-11-04 NOTE — Patient Instructions (Signed)
    Will do referral to Physical Therapy.  Shoulder Pain Many things can cause shoulder pain, including:  An injury.  Moving the arm in the same way again and again (overuse).  Joint pain (arthritis).  Follow these instructions at home: Take these actions to help with your pain:  Squeeze a soft ball or a foam pad as much as you can. This helps to prevent swelling. It also makes the arm stronger.  Take over-the-counter and prescription medicines only as told by your doctor.  If told, put ice on the area: ? Put ice in a plastic bag. ? Place a towel between your skin and the bag. ? Leave the ice on for 20 minutes, 2-3 times per day. Stop putting on ice if it does not help with the pain.  If you were given a shoulder sling or immobilizer: ? Wear it as told. ? Remove it to shower or bathe. ? Move your arm as little as possible. ? Keep your hand moving. This helps prevent swelling.  Contact a doctor if:  Your pain gets worse.  Medicine does not help your pain.  You have new pain in your arm, hand, or fingers. Get help right away if:  Your arm, hand, or fingers: ? Tingle. ? Are numb. ? Are swollen. ? Are painful. ? Turn white or blue. This information is not intended to replace advice given to you by your health care provider. Make sure you discuss any questions you have with your health care provider. Document Released: 05/07/2008 Document Revised: 07/15/2016 Document Reviewed: 03/14/2015 Elsevier Interactive Patient Education  [employer]2018 [employer]

## 2017-11-04 NOTE — Progress Notes (Signed)
Patient complains of pain to left shoulder and arm since receiving flu vaccine in October.  He has received the vaccine for years with no problems.  He's been suffering from a dull ache and then the pain worsens if he moves his arm out from his side.  He has good mobility with the arm close to the body.  Reaching out to put on a sweater or coat, driving, and sleeping on left side all cause pain.  He said it is like a numbness/paralysis type feeling.  He can point to the location on left arm exactly where it hurts.  There is a red dot and he says it's tender.  It's low on the arm for a deltoid injection so not certain if this is where the vaccine was given.  The more movement he does that brings on the pain the more aggravated the area becomes. 1-2 now / when putting on a coat 8-9/10 pain lasting one min and then continues to decrease in pain.Marland Kitchen.   [facility] on Dec 12th.  Subjective:    Patient ID: Randy Cruz, male    DOB: Dec 24, 1955, 61 y.o.   MRN: [id number]  HPI 61 yo male in non acute distress, complains of  left arm pain since getting flu vaccine in left armOctober 2018 [facility] [employer] [facility]..Can go by with a couple days and then notices the pain. Seems to occur when putting on a coat or sweater, or backing the car back into a parking space.  Had trouble sleeping on left side last Thursday took Ibuprofen 400 mg . Cannot recall if it helped it.  Cannot wash back now with left hand. Patient denies weakness, or numbs or tingling. Is right handed but says he does many things left handed , like using the mouse on the computer uses both hands to brush teeth, and painting the house. Though he denies doing anything to aggravate the upper left arm at this time.  Denies neck or lower arm pain or hand pain.   Review of Systems  Constitutional: Negative for chills, fatigue and fever.  HENT: Negative for congestion, ear pain and sore throat.   Eyes: Negative for  discharge and itching.  Respiratory: Negative for cough and shortness of breath.   Cardiovascular: Negative for chest pain, palpitations and leg swelling.  Gastrointestinal: Negative for abdominal pain.  Endocrine: Negative for polydipsia, polyphagia and polyuria.  Genitourinary: Negative for dysuria.  Musculoskeletal: Positive for myalgias (left upper arm see narrative).  Skin: Negative for rash.  Allergic/Immunologic: Negative for environmental allergies and food allergies.  Neurological: Negative for dizziness, syncope and light-headedness.  Hematological: Negative for adenopathy.  Psychiatric/Behavioral: Positive for sleep disturbance (last thursday only). Negative for behavioral problems, self-injury and suicidal ideas.       Objective:   Physical Exam  Constitutional: He appears well-developed and well-nourished.  HENT:  Head: Normocephalic and atraumatic.  Eyes: Conjunctivae and EOM are normal. Pupils are equal, round, and reactive to light.  Neck: Normal range of motion. Neck supple.  Musculoskeletal:  Limited left arm movement see narrative.  Nursing note and vitals reviewed.  No tenderness on cervical , thoracic or vertebral column.  Skin wnl , no signs of swelling , bruising or infection.   5/5 grip strength bilateral.  Able to flex and extend arm without pain.  , lifting left arm over head at full abduction 90 degree  does start with feelings of  pain located deltoid insertion. .Marland Kitchen  Pain  At deltoid insertion on external rotation of the  left arm and Internal abduction.  Right am full range of motion. Assessment & Plan:  Left shoulder pain, possible tendonitis Will try Prednisone taper and physical therapy. Meds ordered this encounter  Medications  . predniSONE (STERAPRED UNI-PAK 21 TAB) 10 MG (21) TBPK tablet    Sig: Take 6 tablets by mouth today then  5 tablets tomorrow then one less everyday there after.take with food.    Dispense:  21 tablet    Refill:  0  Given  VIS sheet on Influenza and to report since he feels this is due to the vaccine. Patient verbalizes understanding and has no questions at discharge. Follow up with the clinic as needed.

## 2017-11-07 ENCOUNTER — Ambulatory Visit: Payer: BLUE CROSS/BLUE SHIELD | Attending: Medical | Admitting: Physical Therapy

## 2017-11-07 DIAGNOSIS — M25512 Pain in left shoulder: Secondary | ICD-10-CM | POA: Diagnosis not present
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
## 2017-11-11 ENCOUNTER — Ambulatory Visit: Payer: BLUE CROSS/BLUE SHIELD | Admitting: Physical Therapy

## 2017-11-13 ENCOUNTER — Encounter: Payer: BLUE CROSS/BLUE SHIELD | Admitting: Physical Therapy

## 2017-11-20 ENCOUNTER — Ambulatory Visit: Payer: BLUE CROSS/BLUE SHIELD | Admitting: Physical Therapy

## 2017-12-16 DIAGNOSIS — N201 Calculus of ureter: Secondary | ICD-10-CM | POA: Diagnosis not present

## 2017-12-24 ENCOUNTER — Ambulatory Visit: Payer: BLUE CROSS/BLUE SHIELD | Admitting: Podiatry

## 2017-12-24 ENCOUNTER — Encounter: Payer: Self-pay | Admitting: Podiatry

## 2017-12-24 DIAGNOSIS — M205X2 Other deformities of toe(s) (acquired), left foot: Secondary | ICD-10-CM

## 2017-12-24 DIAGNOSIS — M6740 Ganglion, unspecified site: Secondary | ICD-10-CM

## 2017-12-24 NOTE — Patient Instructions (Addendum)
Surgery date: January 10, 2018 - the surgical center will call you with time of surgery.   Pre-Operative Instructions  Congratulations, you have decided to take an important step towards improving your quality of life.  You can be assured that the doctors and staff at [facility] will be with you every step of the way.  Here are some important things you should know:  1. Plan to be at the surgery center/hospital at least 1 (one) hour prior to your scheduled time, unless otherwise directed by the surgical center/hospital staff.  You must have a responsible adult accompany you, remain during the surgery and drive you home.  Make sure you have directions to the surgical center/hospital to ensure you arrive on time. 2. If you are having surgery at [facility] or [facility], you will need a copy of your medical history and physical form from your family physician within one month prior to the date of surgery. We will give you a form for your primary physician to complete.  3. We make every effort to accommodate the date you request for surgery.  However, there are times where surgery dates or times have to be moved.  We will contact you as soon as possible if a change in schedule is required.   4. No aspirin/ibuprofen for one week before surgery.  If you are on aspirin, any non-steroidal anti-inflammatory medications (Mobic, Aleve, Ibuprofen) should not be taken seven (7) days prior to your surgery.  You make take Tylenol for pain prior to surgery.  5. Medications - If you are taking daily heart and blood pressure medications, seizure, reflux, allergy, asthma, anxiety, pain or diabetes medications, make sure you notify the surgery center/hospital before the day of surgery so they can tell you which medications you should take or avoid the day of surgery. 6. No food or drink after midnight the night before surgery unless directed otherwise by surgical center/hospital staff. 7. No alcoholic  beverages 24-hours prior to surgery.  No smoking 24-hours prior or 24-hours after surgery. 8. Wear loose pants or shorts. They should be loose enough to fit over bandages, boots, and casts. 9. Don't wear slip-on shoes. Sneakers are preferred. 10. Bring your boot with you to the surgery center/hospital.  Also bring crutches or a walker if your physician has prescribed it for you.  If you do not have this equipment, it will be provided for you after surgery. 11. If you have not been contacted by the surgery center/hospital by the day before your surgery, call to confirm the date and time of your surgery. 12. Leave-time from work may vary depending on the type of surgery you have.  Appropriate arrangements should be made prior to surgery with your employer. 13. Prescriptions will be provided immediately following surgery by your doctor.  Fill these as soon as possible after surgery and take the medication as directed. Pain medications will not be refilled on weekends and must be approved by the doctor. 14. Remove nail polish on the operative foot and avoid getting pedicures prior to surgery. 15. Wash the night before surgery.  The night before surgery wash the foot and leg well with water and the antibacterial soap provided. Be sure to pay special attention to beneath the toenails and in between the toes.  Wash for at least three (3) minutes. Rinse thoroughly with water and dry well with a towel.  Perform this wash unless told not to do so by your physician.  Enclosed: 1 Ice pack (please put in freezer the night before surgery)   1 Hibiclens skin cleaner   Pre-op instructions  If you have any questions regarding the instructions, please do not hesitate to call our office.  [place]: [address], [place], [place] [address] -- [phone]  [place]: [address] [place], [place] [address] -- [phone]  [place]: [address]  [place], [place] [address] -- [phone]  [place]: [address], [address], [place], [place] [address] -- [phone]  Website: [email or web address]

## 2017-12-24 NOTE — Progress Notes (Signed)
He and his wife present today for surgical consult regarding the second third toes of the left foot.  He states these toes are still painful and he would like to have them corrected.  He states that his affects his ability to perform his daily activities.  Objective: Vital signs are stable alert and oriented x3.  Pulses are palpable.  Contracted DIPJ's bilaterally rigid in nature with small mucoid cyst are noted.  These are painful on palpation.  I reviewed old radiographs demonstrating osteoarthritic change.  Otherwise skin texture and turgor are normal deep tendon reflexes are normal muscle strength is normal symmetrical bilateral.  Cutaneous evaluation demonstrates mucoid cysts no other open lesions or wounds.  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  Plan: Discussed etiology pathology conservative versus surgical therapies excision mucoid cysts and arthroplasties of the DIPJ #2 #3 of the left foot were considered for today.  We discussed this in great detail.  He understands this and is amenable to it we will follow-up with me in the near future for surgical intervention.  I did discuss with her in great detail today all of the possible postop complications which may include but not limited to postop pain bleeding swelling infection recurrence need further surgery overcorrection under correction loss of digit loss of limb loss of life.  We dispensed a Darco shoe today we will follow-up with him soon for surgery.

## 2018-01-08 ENCOUNTER — Other Ambulatory Visit: Payer: Self-pay | Admitting: Podiatry

## 2018-01-08 MED ORDER — CEPHALEXIN 500 MG PO CAPS: 500.0000 mg | ORAL_CAPSULE | Freq: Three times a day (TID) | ORAL | 0 refills | Status: DC

## 2018-01-08 MED ORDER — HYDROCODONE-ACETAMINOPHEN 10-325 MG PO TABS: 1.0000 | ORAL_TABLET | Freq: Four times a day (QID) | ORAL | 0 refills | Status: DC | PRN

## 2018-01-10 ENCOUNTER — Encounter: Payer: Self-pay | Admitting: Podiatry

## 2018-01-10 DIAGNOSIS — Z01818 Encounter for other preprocedural examination: Secondary | ICD-10-CM | POA: Diagnosis not present

## 2018-01-10 DIAGNOSIS — M2042 Other hammer toe(s) (acquired), left foot: Secondary | ICD-10-CM | POA: Diagnosis not present

## 2018-01-10 DIAGNOSIS — M205X2 Other deformities of toe(s) (acquired), left foot: Secondary | ICD-10-CM | POA: Diagnosis not present

## 2018-01-13 ENCOUNTER — Telehealth: Payer: Self-pay | Admitting: *Deleted

## 2018-01-13 NOTE — Telephone Encounter (Signed)
POST OP CALL-  L/M for pt to call if he had any questions or concerns regarding surgical recovery.

## 2018-01-16 ENCOUNTER — Ambulatory Visit (INDEPENDENT_AMBULATORY_CARE_PROVIDER_SITE_OTHER): Payer: BLUE CROSS/BLUE SHIELD

## 2018-01-16 ENCOUNTER — Ambulatory Visit (INDEPENDENT_AMBULATORY_CARE_PROVIDER_SITE_OTHER): Payer: BLUE CROSS/BLUE SHIELD | Admitting: Podiatry

## 2018-01-16 VITALS — BP 147/96 | HR 78 | Temp 96.2°F

## 2018-01-16 DIAGNOSIS — M205X2 Other deformities of toe(s) (acquired), left foot: Secondary | ICD-10-CM

## 2018-01-16 DIAGNOSIS — M6740 Ganglion, unspecified site: Secondary | ICD-10-CM

## 2018-01-18 NOTE — Progress Notes (Signed)
He presents today status post excision mucoid cyst with arthroplasties DIPJ #2 #3 of the left foot.  He states that he seems to be doing okay he is only taken Motrin for pain.  Objective: Vital signs are stable alert and oriented x3.  Pulses are palpable.  Sutures are intact margins well coapted radiographs demonstrate well placed arthroplasties.  Assessment: Well-healing surgical toes.  Plan: Discussed etiology pathology conservative versus surgical therapies.  Dress the toes today we will follow-up with him in 1 week for suture removal will continue compression dressings at that time.

## 2018-01-23 ENCOUNTER — Ambulatory Visit (INDEPENDENT_AMBULATORY_CARE_PROVIDER_SITE_OTHER): Payer: Self-pay | Admitting: Podiatry

## 2018-01-23 ENCOUNTER — Encounter: Payer: Self-pay | Admitting: Podiatry

## 2018-01-23 DIAGNOSIS — M6740 Ganglion, unspecified site: Secondary | ICD-10-CM

## 2018-01-23 DIAGNOSIS — M205X2 Other deformities of toe(s) (acquired), left foot: Secondary | ICD-10-CM

## 2018-01-26 NOTE — Progress Notes (Signed)
He presents today for his second postop visit date of surgery January 10, 2018 status post DIPJ arthroplasty second and third toes of the left foot.  He states that he is doing well would like to have his stitches out today.  Objective: Vital signs are stable he is alert and oriented x3.  Pulses are palpable.  Sutures appear to be intact margins are not quite coapted enough to remove the sutures today.  There is no purulence no malodor there is no swelling of the toes it just appears that the margins are not quite ready to have all of the sutures removed.  Assessment: Well-healing surgical foot slightly delayed as far as margin healing.  Plan: I am going to recommend that we leave his sutures in for 1 more week they will be removed next week we will continue to dress the toes with co-band on a daily basis.  We will not get this wet we will keep them elevated as much as possible and continue to use a Darco shoe for the left foot.

## 2018-01-30 ENCOUNTER — Encounter: Payer: Self-pay | Admitting: Podiatry

## 2018-01-30 ENCOUNTER — Ambulatory Visit (INDEPENDENT_AMBULATORY_CARE_PROVIDER_SITE_OTHER): Payer: BLUE CROSS/BLUE SHIELD | Admitting: Podiatry

## 2018-01-30 DIAGNOSIS — M6740 Ganglion, unspecified site: Secondary | ICD-10-CM

## 2018-01-30 DIAGNOSIS — M205X2 Other deformities of toe(s) (acquired), left foot: Secondary | ICD-10-CM

## 2018-01-30 NOTE — Progress Notes (Signed)
He presents today for suture removal status post 3 weeks mallet toe repairs #2 #3 of the left foot.  Date of surgery 01/10/2018.  He denies fever chills nausea vomiting muscle aches and pains states that the toes are little bit tender.  Objective: Vital signs are stable he is alert and oriented x3 margins well coapted sutures are intact once removed margins remain well coapted mild edema to the toes.  Assessment: Well-healing surgical toes.  Plan: Demonstrated compression dressings and will follow up with him in 1 month he will continue to wear compression dressings during the daytime but not at nighttime.  He can get back into regular shoe gear and start washing the foot.

## 2018-02-10 NOTE — Progress Notes (Signed)
Arthroplasty at the DIPJ of the second and third toes left foot.

## 2018-02-25 ENCOUNTER — Ambulatory Visit (INDEPENDENT_AMBULATORY_CARE_PROVIDER_SITE_OTHER): Payer: BLUE CROSS/BLUE SHIELD | Admitting: Podiatry

## 2018-02-25 ENCOUNTER — Ambulatory Visit (INDEPENDENT_AMBULATORY_CARE_PROVIDER_SITE_OTHER): Payer: BLUE CROSS/BLUE SHIELD

## 2018-02-25 DIAGNOSIS — M205X2 Other deformities of toe(s) (acquired), left foot: Secondary | ICD-10-CM

## 2018-02-25 NOTE — Progress Notes (Signed)
He presents today for a postop visit date of surgery January 10, 2018 DIPJ arthroplasty #2 #3 of the left foot.  He states that they are still bothersome particularly with shoe gear seems to rub the top of his shoe particularly the third toe.  He had to cut the tops of his shoes out so that it would not rub.  Objective: Vital signs stable alert and oriented x3.  Pulses are palpable.  Neurologic sensorium is intact.  Toes are rectus.  Radiographs do not demonstrate any acute findings just a surgical toe is healing normally.  Assessment: Well-healing surgical toe #2 and #3 of the left foot status post DIPJ arthroplasties January 10, 2018.  Plan: Discussed etiology pathology conservative versus surgical therapies I expressed to him that once all of the internal stitches have degraded than the toes will begin to relax which should help alleviate some of the extension at the level of the DIPJ resulting in tenderness of his third toe left foot.  He will follow-up with me in 6 weeks if necessary.

## 2018-04-08 ENCOUNTER — Encounter: Payer: BLUE CROSS/BLUE SHIELD | Admitting: Podiatry

## 2018-04-16 ENCOUNTER — Other Ambulatory Visit: Payer: BLUE CROSS/BLUE SHIELD

## 2018-04-22 ENCOUNTER — Other Ambulatory Visit: Payer: BLUE CROSS/BLUE SHIELD

## 2018-04-22 ENCOUNTER — Encounter: Payer: Self-pay | Admitting: Medical

## 2018-04-22 ENCOUNTER — Ambulatory Visit: Payer: BLUE CROSS/BLUE SHIELD | Admitting: Medical

## 2018-04-22 VITALS — BP 143/81 | HR 63 | Temp 98.0°F | Wt 162.4 lb

## 2018-04-22 DIAGNOSIS — B351 Tinea unguium: Secondary | ICD-10-CM

## 2018-04-22 DIAGNOSIS — M25562 Pain in left knee: Secondary | ICD-10-CM

## 2018-04-22 DIAGNOSIS — Z114 Encounter for screening for human immunodeficiency virus [HIV]: Secondary | ICD-10-CM

## 2018-04-22 DIAGNOSIS — G8929 Other chronic pain: Secondary | ICD-10-CM

## 2018-04-22 DIAGNOSIS — Z0000 Encounter for general adult medical examination without abnormal findings: Secondary | ICD-10-CM

## 2018-04-22 DIAGNOSIS — R7309 Other abnormal glucose: Secondary | ICD-10-CM

## 2018-04-22 NOTE — Progress Notes (Signed)
   Subjective:    Patient ID: Randy Cruz, male    DOB: Aug 08, 1956, 62 y.o.   MRN: [id number]  HPI 62 yo male  1, Influenza vaccine felt it during the change of weather painful Took nothing for pain  ( painfull with reaching  backward.. .  "99% of the time it is fine" Reaching backward is when it hurts, moves it forward and the pain reviews. He would like the area marked after the injection of  influenza next year. Does not want to try PT, still doing  PT exercises at home.  2. Would like to see a dermatologist due to  [facility] areas on right thub nail, left thumb nail and left index that are now white in color.stripping on nalls 3. Left knee pain, has history of "bone and bone " told it was  Normal aging. Recently heard on the radio about stem cell injection. Would like to be evaluated / and information on this procedure..   Review of Systems  Allergic/Immunologic: Negative for environmental allergies and food allergies.  Neurological: Negative for dizziness, syncope and light-headedness.  Hematological: Negative for adenopathy.  Psychiatric/Behavioral: Negative for behavioral problems, confusion and suicidal ideas. The patient is not nervous/anxious and is not hyperactive.    5 years ago had a colonoscopy. Took days to recover. everything was normal on exam  per patient. For now he does wanat another one due to complications after colonoscopy thinks a nerve was hitting with arms moving randomly and nerve pain going down on his legs, crawling on the floor due to pain,    Had constipation but no urinary problems.Took a month for it to resolve.  Objective:   Physical Exam  Constitutional: He is oriented to person, place, and time. He appears well-developed and well-nourished.  HENT:  Head: Normocephalic and atraumatic.  Eyes: Pupils are equal, round, and reactive to light. Conjunctivae and EOM are normal.  Neck: Normal range of motion.  Musculoskeletal:  Gets occasional discomfort with  extrnal rotation of the left arm.  Neurological: He is alert and oriented to person, place, and time.  Skin: Skin is warm and dry.  Psychiatric: He has a normal mood and affect. His behavior is normal. Judgment and thought content normal.  Nursing note and vitals reviewed.         Assessment & Plan:  1. Left shoulder pain on external rotation s/p Flu vaccine October 2018. He has been to PT and is still doing the exercises, he states it is 99% better.  2.  Finger nails have changed from black to white on both thumbs and on Left index finger and he would like it looked at by a dermatologist . He states he has seen many doctors before and no was able to give him a diagnosis. Possible Fungus.  3.Left knee pain  chronic, wanting information on Stem cell injections. Given information from [facility] non surgical Stem Cell therapy. He does not want surgery.  Annual Blood work recheck  Bilirubin and A1C will call patient with results. Would like his wife to have a copy of his labs when she returns to the office. HIV screening. He verbalizes understanding and has no questions at discharge.

## 2018-04-22 NOTE — Patient Instructions (Signed)

## 2018-04-23 LAB — CMP12+LP+TP+TSH+6AC+PSA+CBC…
ALT: 23 IU/L (ref 0–44)
AST: 23 IU/L (ref 0–40)
Albumin/Globulin Ratio: 2 (ref 1.2–2.2)
Albumin: 4.3 g/dL (ref 3.5–5.5)
Alkaline Phosphatase: 68 IU/L (ref 39–117)
BASOS ABS: 0 10*3/uL (ref 0.0–0.2)
BILIRUBIN TOTAL: 2.9 mg/dL — AB (ref 0.0–1.2)
BUN/Creatinine Ratio: 21 — ABNORMAL HIGH (ref 9–20)
BUN: 16 mg/dL (ref 6–24)
Basos: 0 %
CHLORIDE: 105 mmol/L (ref 96–106)
CHOLESTEROL TOTAL: 195 mg/dL (ref 100–199)
Calcium: 8.8 mg/dL (ref 8.7–10.2)
Chol/HDL Ratio: 4 ratio (ref 0.0–5.0)
Creatinine, Ser: 0.77 mg/dL (ref 0.76–1.27)
EOS (ABSOLUTE): 0.1 10*3/uL (ref 0.0–0.4)
Eos: 2 %
Estimated CHD Risk: 0.7 times avg. (ref 0.0–1.0)
FREE THYROXINE INDEX: 1.9 (ref 1.2–4.9)
GFR calc Af Amer: 115 mL/min/{1.73_m2} (ref >59)
GFR calc non Af Amer: 99 mL/min/{1.73_m2} (ref >59)
GGT: 42 IU/L (ref 0–65)
Globulin, Total: 2.1 g/dL (ref 1.5–4.5)
Glucose: 124 mg/dL — ABNORMAL HIGH (ref 65–99)
HDL: 49 mg/dL (ref >39)
Hematocrit: 44.3 % (ref 37.5–51.0)
Hemoglobin: 15.2 g/dL (ref 13.0–17.7)
IRON: 135 ug/dL (ref 38–169)
Immature Grans (Abs): 0 10*3/uL (ref 0.0–0.1)
Immature Granulocytes: 0 %
LDH: 179 IU/L (ref 121–224)
LDL Calculated: 124 mg/dL — ABNORMAL HIGH (ref 0–99)
LYMPHS ABS: 2 10*3/uL (ref 0.7–3.1)
Lymphs: 47 %
MCH: 30.9 pg (ref 26.6–33.0)
MCHC: 34.3 g/dL (ref 31.5–35.7)
MCV: 90 fL (ref 79–97)
MONOCYTES: 9 %
Monocytes Absolute: 0.4 10*3/uL (ref 0.1–0.9)
NEUTROS ABS: 1.8 10*3/uL (ref 1.4–7.0)
NEUTROS PCT: 42 %
PHOSPHORUS: 2.4 mg/dL — AB (ref 2.5–4.5)
PROSTATE SPECIFIC AG, SERUM: 2.9 ng/mL (ref 0.0–4.0)
Platelets: 126 10*3/uL — ABNORMAL LOW (ref 150–450)
Potassium: 4 mmol/L (ref 3.5–5.2)
RBC: 4.92 x10E6/uL (ref 4.14–5.80)
RDW: 14.5 % (ref 12.3–15.4)
Sodium: 141 mmol/L (ref 134–144)
T3 UPTAKE RATIO: 28 % (ref 24–39)
T4, Total: 6.7 ug/dL (ref 4.5–12.0)
TOTAL PROTEIN: 6.4 g/dL (ref 6.0–8.5)
TSH: 3.55 u[IU]/mL (ref 0.450–4.500)
Triglycerides: 110 mg/dL (ref 0–149)
Uric Acid: 6.4 mg/dL (ref 3.7–8.6)
VLDL CHOLESTEROL CAL: 22 mg/dL (ref 5–40)
WBC: 4.3 10*3/uL (ref 3.4–10.8)

## 2018-04-23 LAB — HIV ANTIBODY (ROUTINE TESTING W REFLEX): HIV SCREEN 4TH GENERATION: NONREACTIVE

## 2018-04-24 LAB — HGB A1C W/O EAG: Hgb A1c MFr Bld: 5.5 % (ref 4.8–5.6)

## 2018-04-24 LAB — SPECIMEN STATUS REPORT

## 2018-04-24 NOTE — Progress Notes (Signed)
Okay thank you  and will do

## 2018-04-24 NOTE — Progress Notes (Signed)
I the A1C is resulted now. TY.

## 2018-04-29 ENCOUNTER — Telehealth: Payer: Self-pay | Admitting: Medical

## 2018-04-29 DIAGNOSIS — E559 Vitamin D deficiency, unspecified: Secondary | ICD-10-CM

## 2018-04-29 DIAGNOSIS — R7309 Other abnormal glucose: Secondary | ICD-10-CM

## 2018-04-29 DIAGNOSIS — D696 Thrombocytopenia, unspecified: Secondary | ICD-10-CM

## 2018-04-29 NOTE — Telephone Encounter (Signed)
Patient unsure if he had something to drink morning of labs , could 've been coffee with a scoop of ice cream ( ice cream lasts longer in the freezer than milk /cream) or fruit juice.  Didn't realize he was not to have these prior to his fasting labs. Glucose 124    Labs showed elevated glucose, Low platelets, low phosphorus, elevated BUN/Cr ratio. Total Bilirubin elevated.  Reviewed with Dr. Sullivan Lone to have recheck in 3-4 months.  Reviewed labs with patient.   Reviewed with patient he may have water but no coffee with ice cream or fruit juice prior to fasting labs. He verbalizes understanding.  Orders placed in Epic.

## 2018-05-14 DIAGNOSIS — L720 Epidermal cyst: Secondary | ICD-10-CM | POA: Diagnosis not present

## 2018-05-14 DIAGNOSIS — L821 Other seborrheic keratosis: Secondary | ICD-10-CM | POA: Diagnosis not present

## 2018-05-14 DIAGNOSIS — L300 Nummular dermatitis: Secondary | ICD-10-CM | POA: Diagnosis not present

## 2018-05-14 DIAGNOSIS — L601 Onycholysis: Secondary | ICD-10-CM | POA: Diagnosis not present

## 2018-05-16 ENCOUNTER — Telehealth: Payer: Self-pay | Admitting: Medical

## 2018-05-16 NOTE — Telephone Encounter (Signed)
Called patient to make sure he got referral to Dermatology in [place]. Left message with number for return call.

## 2018-05-27 ENCOUNTER — Telehealth: Payer: Self-pay | Admitting: Medical

## 2018-05-27 ENCOUNTER — Other Ambulatory Visit: Payer: Self-pay | Admitting: Medical

## 2018-05-27 NOTE — Progress Notes (Signed)
History of elevated Bilirubin will recheck in  3 months from 04/22/18. Elevated glucose, though he thinks he drank something the day of his fasting labs. Decreased platelets to 124. All labs placed in Epic CBC, Met C, A1C, Vit D.

## 2018-05-27 NOTE — Telephone Encounter (Signed)
Left message for patient to call clinic. Wanting to know if he has seen the dermatologist yet in [place].

## 2018-05-29 ENCOUNTER — Telehealth: Payer: Self-pay | Admitting: Medical

## 2018-05-29 NOTE — Telephone Encounter (Signed)
Patient returned my phone call to him. He did see Dermatology about his nails. They said it could be a pseudomonas infection in which neosporin or bacitracin would work. Otherwise there was nothing more to do at this point.

## 2018-07-09 ENCOUNTER — Other Ambulatory Visit: Payer: BLUE CROSS/BLUE SHIELD

## 2018-07-09 ENCOUNTER — Ambulatory Visit: Payer: BLUE CROSS/BLUE SHIELD | Admitting: Medical

## 2018-07-09 ENCOUNTER — Encounter: Payer: Self-pay | Admitting: Medical

## 2018-07-09 VITALS — BP 148/81 | HR 63 | Temp 96.6°F | Resp 14 | Wt 160.8 lb

## 2018-07-09 DIAGNOSIS — D696 Thrombocytopenia, unspecified: Secondary | ICD-10-CM

## 2018-07-09 DIAGNOSIS — Z0000 Encounter for general adult medical examination without abnormal findings: Secondary | ICD-10-CM

## 2018-07-09 DIAGNOSIS — R7309 Other abnormal glucose: Secondary | ICD-10-CM

## 2018-07-09 DIAGNOSIS — E559 Vitamin D deficiency, unspecified: Secondary | ICD-10-CM

## 2018-07-09 LAB — POCT URINALYSIS DIPSTICK
BILIRUBIN UA: NEGATIVE
GLUCOSE UA: NEGATIVE
KETONES UA: NEGATIVE
Leukocytes, UA: NEGATIVE
Nitrite, UA: NEGATIVE
Protein, UA: NEGATIVE
RBC UA: NEGATIVE
Spec Grav, UA: <=1.005 — AB (ref 1.010–1.025)
Urobilinogen, UA: 0.2 E.U./dL
pH, UA: 5 (ref 5.0–8.0)

## 2018-07-09 NOTE — Patient Instructions (Signed)
Call Cardiologist and check to see if you need an annual exam per his note.    Exercising to Stay Healthy Exercising regularly is important. It has many health benefits, such as:  Improving your overall fitness, flexibility, and endurance.  Increasing your bone density.  Helping with weight control.  Decreasing your body fat.  Increasing your muscle strength.  Reducing stress and tension.  Improving your overall health.  In order to become healthy and stay healthy, it is recommended that you do moderate-intensity and vigorous-intensity exercise. You can tell that you are exercising at a moderate intensity if you have a higher heart rate and faster breathing, but you are still able to hold a conversation. You can tell that you are exercising at a vigorous intensity if you are breathing much harder and faster and cannot hold a conversation while exercising. How often should I exercise? Choose an activity that you enjoy and set realistic goals. Your health care provider can help you to make an activity plan that works for you. Exercise regularly as directed by your health care provider. This may include:  Doing resistance training twice each week, such as: ? Push-ups. ? Sit-ups. ? Lifting weights. ? Using resistance bands.  Doing a given intensity of exercise for a given amount of time. Choose from these options: ? 150 minutes of moderate-intensity exercise every week. ? 75 minutes of vigorous-intensity exercise every week. ? A mix of moderate-intensity and vigorous-intensity exercise every week.  Children, pregnant women, people who are out of shape, people who are overweight, and older adults may need to consult a health care provider for individual recommendations. If you have any sort of medical condition, be sure to consult your health care provider before starting a new exercise program. What are some exercise ideas? Some moderate-intensity exercise ideas include:  Walking  at a rate of 1 mile in 15 minutes.  Biking.  Hiking.  Golfing.  Dancing.  Some vigorous-intensity exercise ideas include:  Walking at a rate of at least 4.5 miles per hour.  Jogging or running at a rate of 5 miles per hour.  Biking at a rate of at least 10 miles per hour.  Lap swimming.  Roller-skating or in-line skating.  Cross-country skiing.  Vigorous competitive sports, such as football, basketball, and soccer.  Jumping rope.  Aerobic dancing.  What are some everyday activities that can help me to get exercise?  Yard work, such as: ? Pushing a Surveyor, mininglawn mower. ? Raking and bagging leaves.  Washing and waxing your car.  Pushing a stroller.  Shoveling snow.  Gardening.  Washing windows or floors. How can I be more active in my day-to-day activities?  Use the stairs instead of the elevator.  Take a walk during your lunch break.  If you drive, park your car farther away from work or school.  If you take public transportation, get off one stop early and walk the rest of the way.  Make all of your phone calls while standing up and walking around.  Get up, stretch, and walk around every 30 minutes throughout the day. What guidelines should I follow while exercising?  Do not exercise so much that you hurt yourself, feel dizzy, or get very short of breath.  Consult your health care provider before starting a new exercise program.  Wear comfortable clothes and shoes with good support.  Drink plenty of water while you exercise to prevent dehydration or heat stroke. Body water is lost during exercise and must  be replaced.  Work out until you breathe faster and your heart beats faster. This information is not intended to replace advice given to you by your health care provider. Make sure you discuss any questions you have with your health care provider. Document Released: 12/22/2010 Document Revised: 04/26/2016 Document Reviewed: 04/22/2014 Elsevier  Interactive Patient Education  2018 [employer]. Testicular Self-Exam A self-exam of your testicles (testicular self-exam) is looking at and feeling your testicles for unusual lumps or swelling. Swelling, lumps, or pain can be caused by:  Injuries.  Puffiness, redness, and soreness (inflammation).  Infection.  Extra fluids around your testicle (hydrocele).  Twisted testicles (testicular torsion).  Cancer of the testicle (testicular cancer).  Why is it important to do a self-exam of testicles? You may need to do self-exams if you are at risk for cancer of the testicles. You may be at risk if you have:  A testicle that has not descended (cryptorchidism).  A history of cancer of the testicle.  A family history of cancer of the testicle.  How to do a self-exam of testicles It is easiest to do a self-exam after a warm bath or shower. Testicles are harder to examine when you are cold. A normal testicle is egg-shaped and feels firm. It is smooth, and it is not tender. At the back of your testicles, there is a firm cord that feels like spaghetti (spermatic cord). Look and feel for changes  Stand and hold your penis away from your body.  Look at each testicle to check for lumps or swelling.  Roll each testicle between your thumb and finger. Feel the whole testicle. Feel for: ? Lumps. ? Swelling. ? Discomfort.  Check for swelling or tender bumps in the groin area. Your groin is where your lower belly (abdomen) meets your upper thighs. Contact a health care provider if:  You find a bump or lump. This may be like a small, hard bump that is the size of a pea.  You find swelling.  You find pain.  You find soreness.  You see or feel any other changes. Summary  A self-exam of your testicles is looking at and feeling your testicles for lumps or swelling.  You may need to do self-exams if you are at risk for cancer of the testicle.  You should check each of your testicles for  lumps, swelling, or discomfort.  You should check for swelling or tender bumps in the groin area. Your groin is where your lower belly (abdomen) meets your upper thighs. This information is not intended to replace advice given to you by your health care provider. Make sure you discuss any questions you have with your health care provider. Document Released: 02/15/2009 Document Revised: 10/15/2016 Document Reviewed: 10/15/2016 Elsevier Interactive Patient Education  2017 [employer].
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
## 2018-07-10 LAB — CBC WITH DIFFERENTIAL/PLATELET
BASOS ABS: 0 10*3/uL (ref 0.0–0.2)
Basos: 1 %
EOS (ABSOLUTE): 0.1 10*3/uL (ref 0.0–0.4)
Eos: 3 %
Hematocrit: 43.8 % (ref 37.5–51.0)
Hemoglobin: 14.8 g/dL (ref 13.0–17.7)
IMMATURE GRANULOCYTES: 0 %
Immature Grans (Abs): 0 10*3/uL (ref 0.0–0.1)
Lymphocytes Absolute: 2.1 10*3/uL (ref 0.7–3.1)
Lymphs: 49 %
MCH: 31.5 pg (ref 26.6–33.0)
MCHC: 33.8 g/dL (ref 31.5–35.7)
MCV: 93 fL (ref 79–97)
MONOS ABS: 0.3 10*3/uL (ref 0.1–0.9)
Monocytes: 7 %
NEUTROS PCT: 40 %
Neutrophils Absolute: 1.7 10*3/uL (ref 1.4–7.0)
PLATELETS: 127 10*3/uL — AB (ref 150–450)
RBC: 4.7 x10E6/uL (ref 4.14–5.80)
RDW: 13.9 % (ref 12.3–15.4)
WBC: 4.2 10*3/uL (ref 3.4–10.8)

## 2018-07-10 LAB — COMPREHENSIVE METABOLIC PANEL
A/G RATIO: 1.8 (ref 1.2–2.2)
ALK PHOS: 70 IU/L (ref 39–117)
ALT: 26 IU/L (ref 0–44)
AST: 26 IU/L (ref 0–40)
Albumin: 4.4 g/dL (ref 3.5–5.5)
BUN/Creatinine Ratio: 22 — ABNORMAL HIGH (ref 9–20)
BUN: 17 mg/dL (ref 6–24)
Bilirubin Total: 1.9 mg/dL — ABNORMAL HIGH (ref 0.0–1.2)
CALCIUM: 8.7 mg/dL (ref 8.7–10.2)
CHLORIDE: 103 mmol/L (ref 96–106)
CO2: 22 mmol/L (ref 20–29)
Creatinine, Ser: 0.79 mg/dL (ref 0.76–1.27)
GFR calc Af Amer: 114 mL/min/{1.73_m2} (ref >59)
GFR calc non Af Amer: 98 mL/min/{1.73_m2} (ref >59)
GLOBULIN, TOTAL: 2.4 g/dL (ref 1.5–4.5)
Glucose: 122 mg/dL — ABNORMAL HIGH (ref 65–99)
POTASSIUM: 4 mmol/L (ref 3.5–5.2)
SODIUM: 141 mmol/L (ref 134–144)
Total Protein: 6.8 g/dL (ref 6.0–8.5)

## 2018-07-10 LAB — VITAMIN D 25 HYDROXY (VIT D DEFICIENCY, FRACTURES): VIT D 25 HYDROXY: 37.8 ng/mL (ref 30.0–100.0)

## 2018-07-10 LAB — HGB A1C W/O EAG: HEMOGLOBIN A1C: 5.7 % — AB (ref 4.8–5.6)

## 2018-09-24 ENCOUNTER — Encounter: Payer: Self-pay | Admitting: Medical

## 2018-09-24 ENCOUNTER — Ambulatory Visit: Payer: BLUE CROSS/BLUE SHIELD | Admitting: Medical

## 2018-09-24 VITALS — BP 162/78 | HR 58 | Temp 97.0°F | Resp 16 | Wt 159.2 lb

## 2018-09-24 DIAGNOSIS — L608 Other nail disorders: Secondary | ICD-10-CM

## 2018-09-24 DIAGNOSIS — Z23 Encounter for immunization: Secondary | ICD-10-CM

## 2018-09-24 DIAGNOSIS — B349 Viral infection, unspecified: Secondary | ICD-10-CM
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
## 2018-09-24 NOTE — Patient Instructions (Addendum)
Influeza Virus Vaccine (Flucelvax) What is this medicine? INFLUENZA VIRUS VACCINE (in floo EN zuh VAHY ruhs vak SEEN) helps to reduce the risk of getting influenza also known as the flu. The vaccine only helps protect you against some strains of the flu. This medicine may be used for other purposes; ask your health care provider or pharmacist if you have questions. COMMON BRAND NAME(S): FLUCELVAX What should I tell my health care provider before I take this medicine? They need to know if you have any of these conditions: -bleeding disorder like hemophilia -fever or infection -Guillain-Barre syndrome or other neurological problems -immune system problems -infection with the human immunodeficiency virus (HIV) or AIDS -low blood platelet counts -multiple sclerosis -an unusual or allergic reaction to influenza virus vaccine, other medicines, foods, dyes or preservatives -pregnant or trying to get pregnant -breast-feeding How should I use this medicine? This vaccine is for injection into a muscle. It is given by a health care professional. A copy of Vaccine Information Statements will be given before each vaccination. Read this sheet carefully each time. The sheet may change frequently. Talk to your pediatrician regarding the use of this medicine in children. Special care may be needed. Overdosage: If you think you've taken too much of this medicine contact a poison control center or emergency room at once. Overdosage: If you think you have taken too much of this medicine contact a poison control center or emergency room at once. NOTE: This medicine is only for you. Do not share this medicine with others. What if I miss a dose? This does not apply. What may interact with this medicine? -chemotherapy or radiation therapy -medicines that lower your immune system like etanercept, anakinra, infliximab, and adalimumab -medicines that treat or prevent blood clots like warfarin -phenytoin -steroid  medicines like prednisone or cortisone -theophylline -vaccines This list may not describe all possible interactions. Give your health care provider a list of all the medicines, herbs, non-prescription drugs, or dietary supplements you use. Also tell them if you smoke, drink alcohol, or use illegal drugs. Some items may interact with your medicine. What should I watch for while using this medicine? Report any side effects that do not go away within 3 days to your doctor or health care professional. Call your health care provider if any unusual symptoms occur within 6 weeks of receiving this vaccine. You may still catch the flu, but the illness is not usually as bad. You cannot get the flu from the vaccine. The vaccine will not protect against colds or other illnesses that may cause fever. The vaccine is needed every year. What side effects may I notice from receiving this medicine? Side effects that you should report to your doctor or health care professional as soon as possible: -allergic reactions like skin rash, itching or hives, swelling of the face, lips, or tongue Side effects that usually do not require medical attention (Report these to your doctor or health care professional if they continue or are bothersome.): -fever -headache -muscle aches and pains -pain, tenderness, redness, or swelling at the injection site -tiredness This list may not describe all possible side effects. Call your doctor for medical advice about side effects. You may report side effects to FDA at 1-800-FDA-1088. Where should I keep my medicine? The vaccine will be given by a health care professional in a clinic, pharmacy, doctor's office, or other health care setting. You will not be given vaccine doses to store at home. NOTE: This sheet is a summary.  It may not cover all possible information. If you have questions about this medicine, talk to your doctor, pharmacist, or health care provider.  2018 Elsevier/Gold  Standard (2011-10-31 14:06:47) Preventing Influenza, Adult Influenza, more commonly known as "the flu," is a viral infection that mainly affects the respiratory tract. The respiratory tract includes structures that help you breathe, such as the lungs, nose, and throat. The flu causes many common cold symptoms, as well as a high fever and body aches. The flu spreads easily from person to person (is contagious). The flu is most common from December through March. This is called flu season.You can catch the flu virus by:  Breathing in droplets from an infected person's cough or sneeze.  Touching something that was recently contaminated with the virus and then touching your mouth, nose, or eyes.  What can I do to lower my risk? You can decrease your risk of getting the flu by:  Getting a flu shot (influenza vaccination) every year. This is the best way to prevent the flu. A flu shot is recommended for everyone age 69 months and older.  Viral Illness, Adult Viruses are tiny germs that can get into a person's body and cause illness. There are many different types of viruses, and they cause many types of illness. Viral illnesses can range from mild to severe. They can affect various parts of the body. Common illnesses that are caused by a virus include colds and the flu. Viral illnesses also include serious conditions such as HIV/AIDS (human immunodeficiency virus/acquired immunodeficiency syndrome). A few viruses have been linked to certain cancers. What are the causes? Many types of viruses can cause illness. Viruses invade cells in your body, multiply, and cause the infected cells to malfunction or die. When the cell dies, it releases more of the virus. When this happens, you develop symptoms of the illness, and the virus continues to spread to other cells. If the virus takes over the function of the cell, it can cause the cell to divide and grow out of control, as is the case when a virus causes  cancer. Different viruses get into the body in different ways. You can get a virus by:  Swallowing food or water that is contaminated with the virus.  Breathing in droplets that have been coughed or sneezed into the air by an infected person.  Touching a surface that has been contaminated with the virus and then touching your eyes, nose, or mouth.  Being bitten by an insect or animal that carries the virus.  Having sexual contact with a person who is infected with the virus.  Being exposed to blood or fluids that contain the virus, either through an open cut or during a transfusion.  If a virus enters your body, your body's defense system (immune system) will try to fight the virus. You may be at higher risk for a viral illness if your immune system is weak. What are the signs or symptoms? Symptoms vary depending on the type of virus and the location of the cells that it invades. Common symptoms of the main types of viral illnesses include: Cold and flu viruses  Fever.  Headache.  Sore throat.  Muscle aches.  Nasal congestion.  Cough. Digestive system (gastrointestinal) viruses  Fever.  Abdominal pain.  Nausea.  Diarrhea. Liver viruses (hepatitis)  Loss of appetite.  Tiredness.  Yellowing of the skin (jaundice). Brain and spinal cord viruses  Fever.  Headache.  Stiff neck.  Nausea and vomiting.  Confusion or sleepiness. Skin viruses  Warts.  Itching.  Rash. Sexually transmitted viruses  Discharge.  Swelling.  Redness.  Rash. How is this treated? Viruses can be difficult to treat because they live within cells. Antibiotic medicines do not treat viruses because these drugs do not get inside cells. Treatment for a viral illness may include:  Resting and drinking plenty of fluids.  Medicines to relieve symptoms. These can include over-the-counter medicine for pain and fever, medicines for cough or congestion, and medicines to relieve  diarrhea.  Antiviral medicines. These drugs are available only for certain types of viruses. They may help reduce flu symptoms if taken early. There are also many antiviral medicines for hepatitis and HIV/AIDS.  Some viral illnesses can be prevented with vaccinations. A common example is the flu shot. Follow these instructions at home: Medicines   Take over-the-counter and prescription medicines only as told by your health care provider.  If you were prescribed an antiviral medicine, take it as told by your health care provider. Do not stop taking the medicine even if you start to feel better.  Be aware of when antibiotics are needed and when they are not needed. Antibiotics do not treat viruses. If your health care provider thinks that you may have a bacterial infection as well as a viral infection, you may get an antibiotic. ? Do not ask for an antibiotic prescription if you have been diagnosed with a viral illness. That will not make your illness go away faster. ? Frequently taking antibiotics when they are not needed can lead to antibiotic resistance. When this develops, the medicine no longer works against the bacteria that it normally fights. General instructions  Drink enough fluids to keep your urine clear or pale yellow.  Rest as much as possible.  Return to your normal activities as told by your health care provider. Ask your health care provider what activities are safe for you.  Keep all follow-up visits as told by your health care provider. This is important. How is this prevented? Take these actions to reduce your risk of viral infection:  Eat a healthy diet and get enough rest.  Wash your hands often with soap and water. This is especially important when you are in public places. If soap and water are not available, use hand sanitizer.  Avoid close contact with friends and family who have a viral illness.  If you travel to areas where viral gastrointestinal infection  is common, avoid drinking water or eating raw food.  Keep your immunizations up to date. Get a flu shot every year as told by your health care provider.  Do not share toothbrushes, nail clippers, razors, or needles with other people.  Always practice safe sex.  Contact a health care provider if:  You have symptoms of a viral illness that do not go away.  Your symptoms come back after going away.  Your symptoms get worse. Get help right away if:  You have trouble breathing.  You have a severe headache or a stiff neck.  You have severe vomiting or abdominal pain. This information is not intended to replace advice given to you by your health care provider. Make sure you discuss any questions you have with your health care provider. Document Released: 03/30/2016 Document Revised: 05/02/2016 Document Reviewed: 03/30/2016 Elsevier Interactive Patient Education  [employer]. ? It is best to get a flu shot in the fall, as soon as it is available. Getting a flu shot during winter  or spring instead is still a good idea. Flu season can last into early spring. ? Preventing the flu through vaccination requires getting a new flu shot every year. This is because the flu virus changes slightly (mutates) from one year to the next. Even if a flu shot does not completely protect you from all flu virus mutations, it can reduce the severity of your illness and prevent dangerous complications of the flu. ? If you are pregnant, you can and should get a flu shot. ? If you have had a reaction to the shot in the past or if you are allergic to eggs, check with your health care provider before getting a flu shot. ? Sometimes the vaccine is available as a nasal spray. In some years, the nasal spray has not been as effective against the flu virus. Check with your health care provider if you have questions about this.  Practicing good health habits. This is especially important during flu season. ? Avoid  contact with people who are sick with flu or cold symptoms. ? Wash your hands with soap and water often. If soap and water are not available, use hand sanitizer. ? Avoid touching your hands to your face, especially when you have not washed your hands recently. ? Use a disinfectant to clean surfaces at home and at work that may be contaminated with the flu virus. ? Keep your body's disease-fighting system (immune system) in good shape by eating a healthy diet, drinking plenty of fluids, getting enough sleep, and exercising regularly.  If you do get the flu, avoid spreading it to others by:  Staying home until your symptoms have been gone for at least one day.  Covering your mouth and nose with your elbow when you cough or sneeze.  Avoiding close contact with others, especially babies and elderly people.  Why are these changes important? Getting a flu shot and practicing good health habits protects you as well as other people. If you get the flu, your friends, family, and co-workers are also at risk of getting it, because it spreads so easily to others. Each year, about 2 out of every 10 people get the flu. Having the flu can lead to complications, such as pneumonia, ear infection, and sinus infection. The flu also can be deadly, especially for babies, people older than age 76, and people who have serious long-term diseases. How is this treated? Most people recover from the flu by resting at home and drinking plenty of fluids. However, a prescription antiviral medicine may reduce your flu symptoms and may make your flu go away sooner. This medicine must be started within a few days of getting flu symptoms. You can talk with your health care provider about whether you need an antiviral medicine. Antiviral medicine may be prescribed for people who are at risk for more serious flu symptoms. This includes people who:  Are older than age 70.  Are pregnant.  Have a condition that makes the flu worse  or more dangerous.  Where to find more information:  Centers for Disease Control and Prevention: tsavxtf.com  ItsBlog.fr: InternetEnthusiasts.hu  American Academy of Family Physicians: familydoctor.org/familydoctor/en/kids/vaccines/preventing-the-flu.html Contact a health care provider if:  You have influenza and you develop new symptoms.  You have: ? Chest pain. ? Diarrhea. ? A fever.  Your cough gets worse, or you produce more mucus. Summary  The best way to prevent the flu is to get a flu shot every year in the fall.  Even if you  get the flu after you have received the yearly vaccine, your flu may be milder and go away sooner because of your flu shot.  If you get the flu, antiviral medicines that are started with a few days of symptoms may reduce your flu symptoms and may make your flu go away sooner.  You can also help prevent the flu by practicing good health habits. This information is not intended to replace advice given to you by your health care provider. Make sure you discuss any questions you have with your health care provider. Document Released: 12/04/2015 Document Revised: 07/28/2016 Document Reviewed: 07/28/2016 Elsevier Interactive Patient Education  2018 Elsevier Inc. Fungal Nail Infection Fungal nail infection is a common fungal infection of the toenails or fingernails. This condition affects toenails more often than fingernails. More than one nail may be infected. The condition can be passed from person to person (is contagious). What are the causes? This condition is caused by a fungus. Several types of funguses can cause the infection. These funguses are common in moist and warm areas. If your hands or feet come into contact with the fungus, it may get into a crack in your fingernail or toenail and cause the infection. What increases the risk? The following factors may make you more likely to develop this condition:  Being  male.  Having diabetes.  Being of older age.  Living with someone who has the fungus.  Walking barefoot in areas where the fungus thrives, such as showers or locker rooms.  Having poor circulation.  Wearing shoes and socks that cause your feet to sweat.  Having athlete's foot.  Having a nail injury or history of a recent nail surgery.  Having psoriasis.  Having a weak body defense system (immune system).  What are the signs or symptoms? Symptoms of this condition include:  A pale spot on the nail.  Thickening of the nail.  A nail that becomes yellow or brown.  A brittle or ragged nail edge.  A crumbling nail.  A nail that has lifted away from the nail bed.  How is this diagnosed? This condition is diagnosed with a physical exam. Your health care provider may take a scraping or clipping from your nail to test for the fungus. How is this treated? Mild infections do not need treatment. If you have significant nail changes, treatment may include:  Oral antifungal medicines. You may need to take the medicine for several weeks or several months, and you may not see the results for a long time. These medicines can cause side effects. Ask your health care provider what problems to watch for.  Antifungal nail polish and nail cream. These may be used along with oral antifungal medicines.  Laser treatment of the nail.  Surgery to remove the nail. This may be needed for the most severe infections.  Treatment takes a long time, and the infection may come back. Follow these instructions at home: Medicines  Take or apply over-the-counter and prescription medicines only as told by your health care provider.  Ask your health care provider about using over-the-counter mentholated ointment on your nails. Lifestyle   Do not share personal items, such as towels or nail clippers.  Trim your nails often.  Wash and dry your hands and feet every day.  Wear absorbent socks,  and change your socks frequently.  Wear shoes that allow air to circulate, such as sandals or canvas tennis shoes. Throw out old shoes.  Wear rubber gloves if you  are working with your hands in wet areas.  Do not walk barefoot in shower rooms or locker rooms.  Do not use a nail salon that does not use clean instruments.  Do not use artificial nails. General instructions  Keep all follow-up visits as told by your health care provider. This is important.  Use antifungal foot powder on your feet and in your shoes. Contact a health care provider if: Your infection is not getting better or it is getting worse after several months. This information is not intended to replace advice given to you by your health care provider. Make sure you discuss any questions you have with your health care provider. Document Released: 11/16/2000 Document Revised: 04/26/2016 Document Reviewed: 05/23/2015 Elsevier Interactive Patient Education  2018 [employer].

## 2018-10-15 DIAGNOSIS — L601 Onycholysis: Secondary | ICD-10-CM | POA: Diagnosis not present

## 2018-11-04 DIAGNOSIS — B9561 Methicillin susceptible Staphylococcus aureus infection as the cause of diseases classified elsewhere: Secondary | ICD-10-CM | POA: Diagnosis not present

## 2018-11-04 DIAGNOSIS — R52 Pain, unspecified: Secondary | ICD-10-CM | POA: Diagnosis not present

## 2018-11-04 DIAGNOSIS — L608 Other nail disorders: Secondary | ICD-10-CM | POA: Diagnosis not present

## 2018-11-04 DIAGNOSIS — B965 Pseudomonas (aeruginosa) (mallei) (pseudomallei) as the cause of diseases classified elsewhere: Secondary | ICD-10-CM | POA: Diagnosis not present

## 2018-11-04 DIAGNOSIS — L601 Onycholysis: Secondary | ICD-10-CM | POA: Diagnosis not present

## 2018-11-04 DIAGNOSIS — B351 Tinea unguium: Secondary | ICD-10-CM | POA: Diagnosis not present

## 2018-11-08 DIAGNOSIS — L089 Local infection of the skin and subcutaneous tissue, unspecified: Secondary | ICD-10-CM | POA: Diagnosis not present

## 2018-11-14 DIAGNOSIS — A498 Other bacterial infections of unspecified site: Secondary | ICD-10-CM | POA: Diagnosis not present

## 2018-11-14 DIAGNOSIS — A4901 Methicillin susceptible Staphylococcus aureus infection, unspecified site: Secondary | ICD-10-CM | POA: Diagnosis not present

## 2018-11-14 DIAGNOSIS — B351 Tinea unguium: Secondary | ICD-10-CM | POA: Diagnosis not present

## 2019-09-15 ENCOUNTER — Other Ambulatory Visit: Payer: Self-pay

## 2019-09-15 ENCOUNTER — Ambulatory Visit: Payer: BC Managed Care – PPO

## 2019-09-15 DIAGNOSIS — Z23 Encounter for immunization: Secondary | ICD-10-CM

## 2020-10-01 ENCOUNTER — Ambulatory Visit: Payer: Self-pay | Attending: Internal Medicine

## 2020-10-01 DIAGNOSIS — Z23 Encounter for immunization: Secondary | ICD-10-CM

## 2020-10-01 NOTE — Progress Notes (Signed)
   Covid-19 Vaccination Clinic  Name:  Randy Cruz    MRN: [id number] DOB: 03/16/1956  10/01/2020  Randy Cruz was observed post Covid-19 immunization for 15 minutes without incident. He was provided with Vaccine Information Sheet and instruction to access the V-Safe system.   Randy Cruz was instructed to call 911 with any severe reactions post vaccine: Marland Kitchen Difficulty breathing  . Swelling of face and throat  . A fast heartbeat  . A bad rash all over body  . Dizziness and weakness

## 2021-03-13 ENCOUNTER — Telehealth: Payer: BC Managed Care – PPO | Admitting: Medical

## 2021-03-13 ENCOUNTER — Other Ambulatory Visit: Payer: Self-pay

## 2021-03-13 NOTE — Progress Notes (Signed)
65 yo male in non acute distress consents to telemedicine appointment.  Pain in RLQ and Right testicle. He denies lifting any thing heavy or any exercise that may have caused this pain. Intermittent pain in RLQ x 2 weeks. Now it radiates to his right testicle, with both  The groin and testicle pain  Increasing in        frequency. If he pushes on the area he gets relief ( approximately     2 x2 inch  area at the RLQ. Sometimes feel as if the pain is pushing outward from the abdomen. He walks daily however he had to shorten his walk yesterday due to pain.  No changes in urinary or bowel habits. Increased pain with sitting ( in the groin) and relief with standing.  He sees no bulge, redness, swelling or bruising. Denies any fever or chills. I recommended a physical exam. He has made an appointment for a physical exam tomorrow at 8 am.

## 2021-03-14 ENCOUNTER — Encounter: Payer: Self-pay | Admitting: Medical

## 2021-03-14 ENCOUNTER — Other Ambulatory Visit: Payer: Self-pay

## 2021-03-14 ENCOUNTER — Ambulatory Visit: Payer: Self-pay | Admitting: Medical

## 2021-03-14 VITALS — BP 132/80 | HR 63 | Temp 97.5°F | Resp 14 | Wt 156.2 lb

## 2021-03-14 DIAGNOSIS — L989 Disorder of the skin and subcutaneous tissue, unspecified: Secondary | ICD-10-CM

## 2021-03-14 DIAGNOSIS — K4090 Unilateral inguinal hernia, without obstruction or gangrene, not specified as recurrent: Secondary | ICD-10-CM

## 2021-03-14 DIAGNOSIS — R21 Rash and other nonspecific skin eruption: Secondary | ICD-10-CM

## 2021-03-14 NOTE — Progress Notes (Addendum)
Sent prescription into pharmacy for Nystatin powder. He currently does not have a flare up.  Now he will have some treatment to try.  Subjective:    Patient ID: Randy Cruz, male    DOB: 1955-12-04, 65 y.o.   MRN: [id number]  HPI 65 yo male in non acute distress, comes into clinic for GU physical exam for possible hernia on the right groin area. Painful to sit, better with standing , painful at ngiht. He walked 3 miles yesterday and it began to hurt, when he presses on the area he states it feels better. Able to poop and pass gas without difficulty.  Covid-19 vaccinated Moderna and boosted  Review of Systems  Gastrointestinal: Negative for constipation, diarrhea and vomiting.  Genitourinary: Positive for testicular pain (right side). Negative for decreased urine volume, difficulty urinating, dysuria, penile pain, penile swelling and scrotal swelling.  Skin: Positive for rash (the start of a rash, one maculopapular  tiny  spot, no surrounding erythema., located onthe medial side of left thigh).   Painful while sitting    Objective:   Physical Exam Exam conducted with a chaperone present.  Constitutional:      Appearance: Normal appearance.  HENT:     Head: Normocephalic and atraumatic.     Mouth/Throat:     Mouth: Mucous membranes are moist.     Pharynx: Oropharynx is clear.  Eyes:     Extraocular Movements: Extraocular movements intact.     Conjunctiva/sclera: Conjunctivae normal.     Pupils: Pupils are equal, round, and reactive to light.  Pulmonary:     Effort: Pulmonary effort is normal.  Genitourinary:    Penis: Normal and circumcised.      Testes:        Right: Tenderness (on palpation) present.     Epididymis:     Right: Normal. Not inflamed or enlarged. No mass or tenderness.     Left: Not inflamed or enlarged. No mass or tenderness.       Comments: Tender at right groin, no swelling, bulging or erythema. Right testicle higher then left he does not know if this is  his baseline. I could not feel hernia on exam  Musculoskeletal:        General: Normal range of motion.     Cervical back: Normal range of motion.  Lymphadenopathy:     Lower Body: No right inguinal adenopathy. No left inguinal adenopathy.  Skin:    General: Skin is warm and dry.     Findings: Lesion (maculopapular red leasion) present.  Neurological:     General: No focal deficit present.     Mental Status: He is alert and oriented to person, place, and time.           Assessment & Plan:  Hernia inguinal right side Orders Placed This Encounter  Procedures  . 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 Ambulatory referral to General Surgery    Referral Priority:   Routine    Referral Type:   Surgical    Referral Reason:   Specialty Services Required    Referred to Provider:   Fritzi Mandes, MD    Requested Specialty:   General Surgery    Number of Visits Requested:   1   if painful (ie.. walking) do not do it this includes lifting , bending, or walking for an extended period Patient verbalizes understanding and has no questions at discharge.Marland Kitchen

## 2021-03-14 NOTE — Patient Instructions (Signed)
Inguinal Hernia, Adult An inguinal hernia is when fat or your intestines push through a weak spot in a muscle where your leg meets your lower belly (groin). This causes a bulge. This kind of hernia could also be:  In your scrotum, if you are male.  In folds of skin around your vagina, if you are male. There are three types of inguinal hernias:  Hernias that can be pushed back into the belly (are reducible). This type rarely causes pain.  Hernias that cannot be pushed back into the belly (are incarcerated).  Hernias that cannot be pushed back into the belly and lose their blood supply (are strangulated). This type needs emergency surgery. What are the causes? This condition is caused by having a weak spot in the muscles or tissues in your groin. This develops over time. The hernia may poke through the weak spot when you strain your lower belly muscles all of a sudden, such as when you:  Lift a heavy object.  Strain to poop (have a bowel movement). Trouble pooping (constipation) can lead to straining.  Cough. What increases the risk? This condition is more likely to develop in:  Males.  Pregnant females.  People who: ? Are overweight. ? Work in jobs that require long periods of standing or heavy lifting. ? Have had an inguinal hernia before. ? Smoke or have lung disease. These factors can lead to long-term (chronic) coughing. What are the signs or symptoms? Symptoms may depend on the size of the hernia. Often, a small hernia has no symptoms. Symptoms of a larger hernia may include:  A bulge in the groin area. This is easier to see when standing. You might not be able to see it when you are lying down.  Pain or burning in the groin. This may get worse when you lift, strain, or cough.  A dull ache or a feeling of pressure in the groin.  An abnormal bulge in the scrotum, in males. Symptoms of a strangulated inguinal hernia may include:  A bulge in your groin that is very  painful and tender to the touch.  A bulge that turns red or purple.  Fever, feeling like you may vomit (nausea), and vomiting.  Not being able to poop or to pass gas. How is this treated? Treatment depends on the size of your hernia and whether you have symptoms. If you do not have symptoms, your doctor may have you watch your hernia carefully and have you come in for follow-up visits. If your hernia is large or if you have symptoms, you may need surgery to repair the hernia. Follow these instructions at home: Lifestyle  Avoid lifting heavy objects.  Avoid standing for long amounts of time.  Do not smoke or use any products that contain nicotine or tobacco. If you need help quitting, ask your doctor.  Stay at a healthy weight. Prevent trouble pooping You may need to take these actions to prevent or treat trouble pooping:  Drink enough fluid to keep your pee (urine) pale yellow.  Take over-the-counter or prescription medicines.  Eat foods that are high in fiber. These include beans, whole grains, and fresh fruits and vegetables.  Limit foods that are high in fat and sugar. These include fried or sweet foods. General instructions  You may try to push your hernia back in place by very gently pressing on it when you are lying down. Do not try to push the bulge back in if it will not go in   easily.  Watch your hernia for any changes in shape, size, or color. Tell your doctor if you see any changes.  Take over-the-counter and prescription medicines only as told by your doctor.  Keep all follow-up visits. Contact a doctor if:  You have a fever or chills.  You have new symptoms.  Your symptoms get worse. Get help right away if:  You have pain in your groin that gets worse all of a sudden.  You have a bulge in your groin that: ? Gets bigger all of a sudden, and it does not get smaller after that. ? Turns red or purple. ? Is painful when you touch it.  You are a male, and  you have: ? Sudden pain in your scrotum. ? A sudden change in the size of your scrotum.  You cannot push the hernia back in place by very gently pressing on it when you are lying down.  You feel like you may vomit, and that feeling does not go away.  You keep vomiting.  You have a fast heartbeat.  You cannot poop or pass gas. These symptoms may be an emergency. Get help right away. Call your local emergency services (911 in the [place]  Do not wait to see if the symptoms will go away.  Do not drive yourself to the hospital. Summary  An inguinal hernia is when fat or your intestines push through a weak spot in a muscle where your leg meets your lower belly (groin). This causes a bulge.  If you do not have symptoms, you may not need treatment. If you have symptoms or a large hernia, you may need surgery.  Avoid lifting heavy objects. Also, avoid standing for long amounts of time.  Do not try to push the bulge back in if it will not go in easily. This information is not intended to replace advice given to you by your health care provider. Make sure you discuss any questions you have with your health care provider. Document Revised: 07/19/2020 Document Reviewed: 07/19/2020 Elsevier Patient Education  2021 Elsevier Inc.  

## 2021-03-17 LAB — HERPES SIMPLEX VIRUS(HSV) DNA BY PCR
HSV 2 DNA: NEGATIVE
HSV-1 DNA: NEGATIVE

## 2021-03-21 ENCOUNTER — Telehealth: Payer: Self-pay

## 2021-03-21 MED ORDER — NYSTATIN 100000 UNIT/GM EX POWD: 1.0000 "application " | Freq: Three times a day (TID) | CUTANEOUS | 0 refills | Status: DC

## 2021-03-21 NOTE — Telephone Encounter (Signed)
Spoke with patient on phone regarding lab results. Patient reported he would try using the nystatin powder for rash on groin when he had a flare up next time to see if that helps. Advised patient to report back if nystatin powder is helpful.

## 2021-03-21 NOTE — Addendum Note (Signed)
Addended by: Geraldyn Shain, Herbert Seta R on: 03/21/2021 11:40 AM   Modules accepted: Orders

## 2021-03-31 DIAGNOSIS — Z23 Encounter for immunization: Secondary | ICD-10-CM | POA: Diagnosis not present

## 2021-04-05 DIAGNOSIS — R1031 Right lower quadrant pain: Secondary | ICD-10-CM | POA: Diagnosis not present

## 2021-04-07 ENCOUNTER — Other Ambulatory Visit: Payer: Self-pay | Admitting: Surgery

## 2021-04-07 DIAGNOSIS — R1031 Right lower quadrant pain: Secondary | ICD-10-CM

## 2021-04-11 ENCOUNTER — Ambulatory Visit
Admission: RE | Admit: 2021-04-11 | Discharge: 2021-04-11 | Disposition: A | Discharge status: Home / Self Care | Payer: BC Managed Care – PPO | Source: Ambulatory Visit | Attending: Surgery | Admitting: Surgery

## 2021-04-11 ENCOUNTER — Other Ambulatory Visit: Payer: Self-pay

## 2021-04-11 DIAGNOSIS — N281 Cyst of kidney, acquired: Secondary | ICD-10-CM | POA: Diagnosis not present

## 2021-04-11 DIAGNOSIS — Q676 Pectus excavatum: Secondary | ICD-10-CM | POA: Diagnosis not present

## 2021-04-11 DIAGNOSIS — N400 Enlarged prostate without lower urinary tract symptoms: Secondary | ICD-10-CM | POA: Diagnosis not present

## 2021-04-11 DIAGNOSIS — N420 Calculus of prostate: Secondary | ICD-10-CM | POA: Diagnosis not present

## 2021-04-11 DIAGNOSIS — R1031 Right lower quadrant pain: Secondary | ICD-10-CM

## 2021-04-11 MED ORDER — IOPAMIDOL (ISOVUE-300) INJECTION 61%
100.0000 mL | Freq: Once | INTRAVENOUS | Status: AC | PRN
  Administered 2021-04-11: 100 mL via INTRAVENOUS

## 2021-04-17 ENCOUNTER — Other Ambulatory Visit: Payer: Self-pay

## 2021-04-17 DIAGNOSIS — Z125 Encounter for screening for malignant neoplasm of prostate: Secondary | ICD-10-CM

## 2021-04-17 DIAGNOSIS — Z0000 Encounter for general adult medical examination without abnormal findings: Secondary | ICD-10-CM

## 2021-04-17 NOTE — Progress Notes (Signed)
Compared to 2018 EKG. Lots of artifact, patient is asymptomatic. Essentially wnl per Dr. Sullivan Lone.

## 2021-04-18 LAB — CMP12+LP+TP+TSH+6AC+PSA+CBC…
ALT: 19 IU/L (ref 0–44)
AST: 17 IU/L (ref 0–40)
Albumin/Globulin Ratio: 2.1 (ref 1.2–2.2)
Albumin: 4.6 g/dL (ref 3.8–4.8)
Alkaline Phosphatase: 65 IU/L (ref 44–121)
BUN/Creatinine Ratio: 24 (ref 10–24)
BUN: 19 mg/dL (ref 8–27)
Basophils Absolute: 0 10*3/uL (ref 0.0–0.2)
Basos: 0 %
Bilirubin Total: 1.6 mg/dL — ABNORMAL HIGH (ref 0.0–1.2)
Calcium: 8.9 mg/dL (ref 8.6–10.2)
Chloride: 104 mmol/L (ref 96–106)
Chol/HDL Ratio: 3.7 ratio (ref 0.0–5.0)
Cholesterol, Total: 208 mg/dL — ABNORMAL HIGH (ref 100–199)
Creatinine, Ser: 0.79 mg/dL (ref 0.76–1.27)
EOS (ABSOLUTE): 0.1 10*3/uL (ref 0.0–0.4)
Eos: 2 %
Estimated CHD Risk: 0.6 times avg. (ref 0.0–1.0)
Free Thyroxine Index: 1.9 (ref 1.2–4.9)
GGT: 39 IU/L (ref 0–65)
Globulin, Total: 2.2 g/dL (ref 1.5–4.5)
Glucose: 117 mg/dL — ABNORMAL HIGH (ref 65–99)
HDL: 56 mg/dL (ref >39)
Hematocrit: 43.7 % (ref 37.5–51.0)
Hemoglobin: 15.3 g/dL (ref 13.0–17.7)
Immature Grans (Abs): 0 10*3/uL (ref 0.0–0.1)
Immature Granulocytes: 0 %
Iron: 93 ug/dL (ref 38–169)
LDH: 175 IU/L (ref 121–224)
LDL Chol Calc (NIH): 137 mg/dL — ABNORMAL HIGH (ref 0–99)
Lymphocytes Absolute: 2 10*3/uL (ref 0.7–3.1)
Lymphs: 46 %
MCH: 32.2 pg (ref 26.6–33.0)
MCHC: 35 g/dL (ref 31.5–35.7)
MCV: 92 fL (ref 79–97)
Monocytes Absolute: 0.2 10*3/uL (ref 0.1–0.9)
Monocytes: 5 %
Neutrophils Absolute: 2 10*3/uL (ref 1.4–7.0)
Neutrophils: 47 %
Phosphorus: 2.3 mg/dL — ABNORMAL LOW (ref 2.8–4.1)
Platelets: 143 10*3/uL — ABNORMAL LOW (ref 150–450)
Potassium: 4 mmol/L (ref 3.5–5.2)
Prostate Specific Ag, Serum: 3.9 ng/mL (ref 0.0–4.0)
RBC: 4.75 x10E6/uL (ref 4.14–5.80)
RDW: 13 % (ref 11.6–15.4)
Sodium: 141 mmol/L (ref 134–144)
T3 Uptake Ratio: 29 % (ref 24–39)
T4, Total: 6.5 ug/dL (ref 4.5–12.0)
TSH: 2.68 u[IU]/mL (ref 0.450–4.500)
Total Protein: 6.8 g/dL (ref 6.0–8.5)
Triglycerides: 86 mg/dL (ref 0–149)
Uric Acid: 6.5 mg/dL (ref 3.8–8.4)
VLDL Cholesterol Cal: 15 mg/dL (ref 5–40)
WBC: 4.3 10*3/uL (ref 3.4–10.8)
eGFR: 100 mL/min/{1.73_m2} (ref >59)

## 2021-04-18 LAB — HGB A1C W/O EAG: Hgb A1c MFr Bld: 5.7 % — ABNORMAL HIGH (ref 4.8–5.6)

## 2021-04-18 LAB — VITAMIN D 25 HYDROXY (VIT D DEFICIENCY, FRACTURES): Vit D, 25-Hydroxy: 59 ng/mL (ref 30.0–100.0)

## 2021-05-05 DIAGNOSIS — N451 Epididymitis: Secondary | ICD-10-CM | POA: Diagnosis not present

## 2021-05-05 DIAGNOSIS — N410 Acute prostatitis: Secondary | ICD-10-CM | POA: Diagnosis not present

## 2021-05-15 ENCOUNTER — Telehealth: Payer: Self-pay | Admitting: Medical

## 2021-05-15 ENCOUNTER — Other Ambulatory Visit: Payer: Self-pay

## 2021-05-15 DIAGNOSIS — R109 Unspecified abdominal pain: Secondary | ICD-10-CM

## 2021-05-15 NOTE — Progress Notes (Signed)
65 yo male in non acute distress seen in clinic for  Right lower inguinal pain questional hernia seen at surgery Sophronia Simas MD  an CT w and w/o contrast was completed. He was then referred to Urology for  examination of his kidneys. He was told they were fine.   His wife called to day asking for a gastroenterologist appoinsment for "abdominal pain.".   He has not has a Colonoscopy prior delay in care due to Covid-19 and patient not wanting to come to the clinic for fear of catching Covid-19.   Will refer to [facility] in [place], Other clinics like [facility],  [facility] which are scheduling to Sept and Aug respectively.   CLINICAL DATA:  Abdominal pain, primarily right-sided in periumbilical   EXAM: CT ABDOMEN AND PELVIS WITH CONTRAST   TECHNIQUE: Multidetector CT imaging of the abdomen and pelvis was performed using the standard protocol following bolus administration of intravenous contrast. Oral contrast was also administered.   CONTRAST:  ISOVUE-300 IOPAMIDOL (ISOVUE-300) INJECTION 61%   COMPARISON:  July 31, 2016   FINDINGS: Lower chest: Lung bases are clear. There is a degree of pectus excavatum.   Hepatobiliary: There is hepatic steatosis. No focal liver lesions are evident. No abnormality involving the gallbladder appreciable by CT. No biliary duct dilatation.   Pancreas: There is no pancreatic mass or inflammatory focus.   Spleen: No splenic lesions are evident.   Adrenals/Urinary Tract: Adrenals bilaterally appear unremarkable. There is a 7 x 4 mm cyst in the lateral lower pole right kidney. There is a 5 mm cyst in the lower pole of the left kidney. There is no appreciable hydronephrosis on either side. There is no renal or ureteral calculus on either side. Urinary bladder is midline with wall thickness within normal limits.   Stomach/Bowel: Moderate stool throughout colon. There is no appreciable bowel wall or mesenteric thickening. No evident  bowel obstruction. Terminal ileum appears normal. Appendix appears normal. There is no appreciable free air or portal venous air.   Vascular/Lymphatic: There is no abdominal aortic aneurysm. No appreciable arterial vascular lesions. Major venous structures appear patent. There is no evident adenopathy in the abdomen or pelvis.   Reproductive: Prostate is mildly enlarged with prostate indenting upon the inferior aspect of the bladder. Occasional prostatic calculi noted. Seminal vesicles appear normal bilaterally.   Other: There is no appreciable abscess or ascites in the abdomen or pelvis. No bowel containing hernia evident. Minimal fat in the umbilicus noted.   Musculoskeletal: No blastic or lytic bone lesions. No abdominal wall or intramuscular lesions are evident.   IMPRESSION: 1. No appreciable bowel wall thickening or bowel obstruction. No abscess in the abdomen or pelvis. Normal appearing appendix.   2. Prominent prostate which may warrant clinical assessment and PSA evaluation.   3. No renal or ureteral calculi. No hydronephrosis. Urinary bladder wall thickness within normal limits.   4.  Hepatic steatosis.     Electronically Signed   By: Bretta Bang III M.D.   On: 04/11/2021 13:58      Result History  CT ABDOMEN PELVIS W CONTRAST (Order #[id number]) on 04/11/2021 - Order Result History Report  MyChart Results Release  MyChart Status: Code Expired  Results Release    Encounter-Level Documents on 04/11/2021:  Electronic signature on 04/11/2021 12:37 PM - E-signed Scan on 04/11/2021 1:19 PM by Default, Provider, MD Scan on 04/07/2021 12:11 PM by Default, Provider, MD

## 2021-05-23 ENCOUNTER — Encounter: Payer: Self-pay | Admitting: Nurse Practitioner

## 2021-05-25 ENCOUNTER — Other Ambulatory Visit: Payer: Self-pay | Admitting: Medical

## 2021-05-25 ENCOUNTER — Other Ambulatory Visit: Payer: Self-pay

## 2021-05-25 ENCOUNTER — Ambulatory Visit
Admission: RE | Admit: 2021-05-25 | Discharge: 2021-05-25 | Disposition: A | Discharge status: Home / Self Care | Payer: BC Managed Care – PPO | Source: Ambulatory Visit | Attending: Medical | Admitting: Medical

## 2021-05-25 DIAGNOSIS — R1011 Right upper quadrant pain: Secondary | ICD-10-CM

## 2021-05-25 DIAGNOSIS — K760 Fatty (change of) liver, not elsewhere classified: Secondary | ICD-10-CM | POA: Diagnosis not present

## 2021-05-25 NOTE — Progress Notes (Signed)
Looking for gallstones or inflammation, due to patient pain in the RUQ and epigastric area. Order U/S RUQ

## 2021-06-14 ENCOUNTER — Other Ambulatory Visit: Payer: Self-pay

## 2021-06-22 ENCOUNTER — Ambulatory Visit: Payer: BC Managed Care – PPO | Admitting: Nurse Practitioner

## 2021-06-22 ENCOUNTER — Encounter: Payer: Self-pay | Admitting: Nurse Practitioner

## 2021-06-22 VITALS — BP 132/80 | HR 62 | Ht 69.0 in | Wt 152.5 lb

## 2021-06-22 DIAGNOSIS — R1011 Right upper quadrant pain: Secondary | ICD-10-CM

## 2021-06-22 DIAGNOSIS — R1084 Generalized abdominal pain: Secondary | ICD-10-CM

## 2021-06-22 DIAGNOSIS — R1012 Left upper quadrant pain: Secondary | ICD-10-CM | POA: Diagnosis not present

## 2021-06-22 MED ORDER — OMEPRAZOLE 20 MG PO CPDR: 20.0000 mg | DELAYED_RELEASE_CAPSULE | Freq: Every day | ORAL | 0 refills | Status: DC

## 2021-06-22 NOTE — Patient Instructions (Signed)
If you are age 63 or younger, your body mass index should be between 19-25. Your Body mass index is 22.52 kg/m. If this is out of the aformentioned range listed, please consider follow up with your Primary Care Provider.  _________________________________________________________ The [facility] providers would like to encourage you to use [facility] to communicate with providers for non-urgent requests or questions.  Due to long hold times on the telephone, sending your provider a message by [facility] may be a faster and more efficient way to get a response.  Please allow 48 business hours for a response.  Please remember that this is for non-urgent requests.  _________________________________________________________ We have sent the following medications to your pharmacy for you to pick up at your convenience:  START: Omeprazole 20mg  one capsule prior to breakfast. _________________________________________________________  We will attempt to obtain colon report from . _________________________________________________________ [place] have been scheduled for a HIDA scan at [facility] (1st floor) on 07-13-2021. Please arrive 30 minutes prior to your scheduled appointment at  09-12-2021. Make certain not to have anything to eat or drink at least 6 hours prior to your test. Should this appointment date or time not work well for you, please call radiology scheduling at [phone].   hepatobiliary (HIDA) scan is an imaging procedure used to diagnose problems in the liver, gallbladder and bile ducts. In the HIDA scan, a radioactive chemical or tracer is injected into a vein in your arm. The tracer is handled by the liver like bile. Bile is a fluid produced and excreted by your liver that helps your digestive system break down fats in the foods you eat. Bile is stored in your gallbladder and the gallbladder releases the bile when you eat a meal. A special nuclear medicine scanner (gamma camera) tracks  the flow of the tracer from your liver into your gallbladder and small intestine.   During your HIDA scan  You'll be asked to change into a hospital gown before your HIDA scan begins. Your health care team will position you on a table, usually on your back. The radioactive tracer is then injected into a vein in your arm.The tracer travels through your bloodstream to your liver, where it's taken up by the bile-producing cells. The radioactive tracer travels with the bile from your liver into your gallbladder and through your bile ducts to your small intestine.You may feel some pressure while the radioactive tracer is injected into your vein. As you lie on the table, a special gamma camera is positioned over your abdomen taking pictures of the tracer as it moves through your body. The gamma camera takes pictures continually for about an hour. You'll need to keep still during the HIDA scan. This can become uncomfortable, but you may find that you can lessen the discomfort by taking deep breaths and thinking about other things. Tell your health care team if you're uncomfortable. The radiologist will watch on a computer the progress of the radioactive tracer through your body. The HIDA scan may be stopped when the radioactive tracer is seen in the gallbladder and enters your small intestine. This typically takes about an hour. In some cases extra imaging will be performed if original images aren't satisfactory, if morphine is given to help visualize the gallbladder or if the medication CCK is given to look at the contraction of the gallbladder. This test typically takes 2 hours to complete. _________________________________________________________  Due to recent changes in healthcare laws, you may see the results of your imaging and  laboratory studies on MyChart before your provider has had a chance to review them.  We understand that in some cases there may be results that are confusing or concerning to you. Not  all laboratory results come back in the same time frame and the provider may be waiting for multiple results in order to interpret others.  Please give [place] 48 hours in order for your provider to thoroughly review all the results before contacting the office for clarification of your results.   Thank you for entrusting me with your care and choosing [facility].  Willette Cluster, NP
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
## 2021-06-23 NOTE — Progress Notes (Signed)
Reviewed and agree with management plans. Awaiting records from prior colonoscopy to determine surveillance interval.   Jaques Mineer L. Orvan Falconer, MD, MPH

## 2021-06-27 ENCOUNTER — Telehealth: Payer: Self-pay | Admitting: Medical

## 2021-06-27 ENCOUNTER — Other Ambulatory Visit: Payer: Self-pay

## 2021-06-27 DIAGNOSIS — N50811 Right testicular pain: Secondary | ICD-10-CM

## 2021-06-27 DIAGNOSIS — L089 Local infection of the skin and subcutaneous tissue, unspecified: Secondary | ICD-10-CM

## 2021-06-27 DIAGNOSIS — R1011 Right upper quadrant pain: Secondary | ICD-10-CM

## 2021-06-27 MED ORDER — TAMSULOSIN HCL 0.4 MG PO CAPS: 0.4000 mg | ORAL_CAPSULE | Freq: Every day | ORAL | 1 refills | Status: DC

## 2021-06-27 MED ORDER — SULFAMETHOXAZOLE-TRIMETHOPRIM 800-160 MG PO TABS: 1.0000 | ORAL_TABLET | Freq: Two times a day (BID) | ORAL | 0 refills | Status: DC

## 2021-06-27 NOTE — Progress Notes (Signed)
   Subjective:    Patient ID: Moyses Pavey, male    DOB: 1956-08-17, 65 y.o.   MRN: [id number]  HPI 65 yo male in non acute distress consents telephone , with his wife Elouise Munroe on the phone call.  Infection in left index finger, > one month, previously seen by dermatology and given no answers to the  the darkened areas on nails. "I had this for years". Has been soaking nail in dilute vinegar/water solution which seems to help.Has had pus draining from side of nail.  2.   Right sided pain upper quadrant pain, pending HIDA scan in August, pain is now constant but varies in intensity. Pain initially relieved by eating but  then worsens after eating. Pain started in April of 2022.   3.    Right testicular pain returns since yesterday. Seen by Urology and treated with Flomax and  Mobic for prostatitis . He states his chronic knee pain improved while on the Mobic  So he knows it was working.Micah Flesher to GI and saw NP Wilmon Pali she recommended he stop the Mobic, this may be causing some of the patients right sided pain possible Peptic ulcer disease.. Started on Omeprazole.he takes this prior to eating  in the morning and he states he has noted no difference in his right sided pain with the medication.  Vaccinated against Covid-19   Allergies  Allergen Reactions   Demerol [Meperidine] Nausea And Vomiting     Review of Systems  Constitutional:  Negative for chills and fever.  Gastrointestinal:  Positive for abdominal pain.  Genitourinary:  Positive for testicular pain (right).  Skin:  Positive for color change (left index finger with pus like discharge.).      Objective:   Physical Exam AXOX3 No physical exam performed due to telemedicine appointment.       Assessment & Plan:  RUQ pain pending HIDA Scan by GI Left 2nd finger pain / infection will treat with Bactrim DS Right Testicular pain will try patient on Flomax and see if he gets any relief. Will hold off on Mobic to see if  his pain improves on Omeprazole.. Meds ordered this encounter  Medications   sulfamethoxazole-trimethoprim (BACTRIM DS) 800-160 MG tablet    Sig: Take 1 tablet by mouth 2 (two) times daily.    Dispense:  20 tablet    Refill:  0   tamsulosin (FLOMAX) 0.4 MG CAPS capsule    Sig: Take 1 capsule (0.4 mg total) by mouth daily.    Dispense:  30 capsule    Refill:  1    Discussed the possibility of Internal Medicine management due to multiple problems and him not happy about GI consult. Asked Dr. Sullivan Lone who he would recommended at this time none  in town accepting new patients.  Will look in [place] location for IM MD. Patient verbalizes understanding of medication and plan to move ahead, has no questions at the end of our conversation.

## 2021-06-29 ENCOUNTER — Emergency Department (HOSPITAL_COMMUNITY): Payer: BC Managed Care – PPO

## 2021-06-29 ENCOUNTER — Emergency Department (HOSPITAL_COMMUNITY)
Admission: EM | Admit: 2021-06-29 | Discharge: 2021-06-29 | Disposition: A | Discharge status: Home / Self Care | Payer: BC Managed Care – PPO | Attending: Emergency Medicine | Admitting: Emergency Medicine

## 2021-06-29 ENCOUNTER — Encounter (HOSPITAL_COMMUNITY): Payer: Self-pay

## 2021-06-29 DIAGNOSIS — I878 Other specified disorders of veins: Secondary | ICD-10-CM | POA: Diagnosis not present

## 2021-06-29 DIAGNOSIS — R17 Unspecified jaundice: Secondary | ICD-10-CM

## 2021-06-29 DIAGNOSIS — R109 Unspecified abdominal pain: Secondary | ICD-10-CM | POA: Insufficient documentation

## 2021-06-29 DIAGNOSIS — R1031 Right lower quadrant pain: Secondary | ICD-10-CM | POA: Diagnosis not present

## 2021-06-29 DIAGNOSIS — N50811 Right testicular pain: Secondary | ICD-10-CM | POA: Diagnosis not present

## 2021-06-29 DIAGNOSIS — R3129 Other microscopic hematuria: Secondary | ICD-10-CM | POA: Diagnosis not present

## 2021-06-29 DIAGNOSIS — N200 Calculus of kidney: Secondary | ICD-10-CM | POA: Diagnosis not present

## 2021-06-29 DIAGNOSIS — D696 Thrombocytopenia, unspecified: Secondary | ICD-10-CM

## 2021-06-29 LAB — COMPREHENSIVE METABOLIC PANEL
ALT: 18 U/L (ref 0–44)
AST: 22 U/L (ref 15–41)
Albumin: 4.2 g/dL (ref 3.5–5.0)
Alkaline Phosphatase: 53 U/L (ref 38–126)
Anion gap: 9 (ref 5–15)
BUN: 16 mg/dL (ref 8–23)
CO2: 26 mmol/L (ref 22–32)
Calcium: 8.7 mg/dL — ABNORMAL LOW (ref 8.9–10.3)
Chloride: 102 mmol/L (ref 98–111)
Creatinine, Ser: 0.83 mg/dL (ref 0.61–1.24)
GFR, Estimated: >60 mL/min (ref >60)
Glucose, Bld: 143 mg/dL — ABNORMAL HIGH (ref 70–99)
Potassium: 3.9 mmol/L (ref 3.5–5.1)
Sodium: 137 mmol/L (ref 135–145)
Total Bilirubin: 4.6 mg/dL — ABNORMAL HIGH (ref 0.3–1.2)
Total Protein: 6.9 g/dL (ref 6.5–8.1)

## 2021-06-29 LAB — URINALYSIS, ROUTINE W REFLEX MICROSCOPIC
Bilirubin Urine: NEGATIVE
Glucose, UA: NEGATIVE mg/dL
Ketones, ur: 5 mg/dL — AB
Leukocytes,Ua: NEGATIVE
Nitrite: NEGATIVE
Protein, ur: NEGATIVE mg/dL
Specific Gravity, Urine: 1.011 (ref 1.005–1.030)
pH: 5 (ref 5.0–8.0)

## 2021-06-29 LAB — CBC WITH DIFFERENTIAL/PLATELET
Abs Immature Granulocytes: 0.03 10*3/uL (ref 0.00–0.07)
Basophils Absolute: 0 10*3/uL (ref 0.0–0.1)
Basophils Relative: 0 %
Eosinophils Absolute: 0 10*3/uL (ref 0.0–0.5)
Eosinophils Relative: 0 %
HCT: 42.1 % (ref 39.0–52.0)
Hemoglobin: 14.9 g/dL (ref 13.0–17.0)
Immature Granulocytes: 1 %
Lymphocytes Relative: 4 %
Lymphs Abs: 0.2 10*3/uL — ABNORMAL LOW (ref 0.7–4.0)
MCH: 31.9 pg (ref 26.0–34.0)
MCHC: 35.4 g/dL (ref 30.0–36.0)
MCV: 90.1 fL (ref 80.0–100.0)
Monocytes Absolute: 0.2 10*3/uL (ref 0.1–1.0)
Monocytes Relative: 2 %
Neutro Abs: 5.8 10*3/uL (ref 1.7–7.7)
Neutrophils Relative %: 93 %
Platelets: 100 10*3/uL — ABNORMAL LOW (ref 150–400)
RBC: 4.67 MIL/uL (ref 4.22–5.81)
RDW: 12.8 % (ref 11.5–15.5)
WBC: 6.3 10*3/uL (ref 4.0–10.5)
nRBC: 0 % (ref 0.0–0.2)

## 2021-06-29 LAB — LACTIC ACID, PLASMA: Lactic Acid, Venous: 1.2 mmol/L (ref 0.5–1.9)

## 2021-06-29 MED ORDER — HYDROCODONE-ACETAMINOPHEN 5-325 MG PO TABS: 1.0000 | ORAL_TABLET | Freq: Four times a day (QID) | ORAL | 0 refills | Status: DC | PRN

## 2021-06-29 NOTE — ED Triage Notes (Signed)
Pt arrived via POV, states he has been having right testicular and flank pain since march, has been seen by PCP, GI and no dx has been found. Has had blood work, Dance movement psychotherapist and [place] with no results. States pain worsened tonight. Denies any n/v or diarrhea.

## 2021-06-29 NOTE — ED Provider Notes (Signed)
Signout from Dr. Quentin Angst.  65 year old male here with some right-sided abdomen and flank pain.  This is been going on for months and has seen general surgery GI primary care doctor urology.  Pain is worsened overnight and radiates to right testicle.  Plan is to follow-up on CT renal and lab work.  Disposition per results of testing. Physical Exam  BP 137/87 (BP Location: Left Arm)   Pulse 83   Temp 98 F (36.7 C) (Oral)   Resp 18   SpO2 100%   Physical Exam  ED Course/Procedures     Procedures  MDM  Reviewed results of CT and lab work with patient.  I explained my rationale where I did not think this finding of some stranding near the appendix fit with his clinical story of abdominal pain.  He seems to be very frustrated wide no etiology of his ongoing symptoms from March have been identified.  Recommended close follow-up with his GI team and urology.  We will provide short prescription of some pain medication for symptomatic relief.       Terrilee Files, MD 06/29/21 [phone]
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
## 2021-06-29 NOTE — Discharge Instructions (Addendum)
You were seen in the emergency department for continued right-sided abdominal pain and right testicular pain that is been on and off for months.  You had lab work urinalysis and a CAT scan that did not show a definite answer for your symptoms.  Your platelets were mildly low and your bilirubin was slightly elevated.  This will need to be followed up with your primary care doctor and your GI team.  We are prescribing a short course of some pain medicine to use if needed.  Return to the emergency department if any worsening or concerning symptoms

## 2021-06-30 ENCOUNTER — Telehealth: Payer: Self-pay | Admitting: Nurse Practitioner

## 2021-06-30 ENCOUNTER — Telehealth: Payer: Self-pay | Admitting: Gastroenterology

## 2021-06-30 NOTE — Telephone Encounter (Signed)
Error

## 2021-06-30 NOTE — Telephone Encounter (Signed)
Patients wife called stating the patient was just discharged from ED seeking advise on what to do next he has had chronic abdominal pain along with fevers. Also said there is only one phone in the house and it might take the patient some time to pick up.
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
## 2021-07-03 ENCOUNTER — Other Ambulatory Visit: Payer: Self-pay

## 2021-07-03 ENCOUNTER — Telehealth: Payer: BC Managed Care – PPO | Admitting: Medical

## 2021-07-03 DIAGNOSIS — R109 Unspecified abdominal pain: Secondary | ICD-10-CM

## 2021-07-03 DIAGNOSIS — K5903 Drug induced constipation: Secondary | ICD-10-CM

## 2021-07-03 DIAGNOSIS — L089 Local infection of the skin and subcutaneous tissue, unspecified: Secondary | ICD-10-CM

## 2021-07-03 DIAGNOSIS — N50811 Right testicular pain: Secondary | ICD-10-CM

## 2021-07-03 DIAGNOSIS — M62838 Other muscle spasm: Secondary | ICD-10-CM

## 2021-07-03 DIAGNOSIS — R1011 Right upper quadrant pain: Secondary | ICD-10-CM

## 2021-07-03 MED ORDER — CYCLOBENZAPRINE HCL 5 MG PO TABS: ORAL_TABLET | ORAL | 1 refills | Status: DC

## 2021-07-03 MED ORDER — BISACODYL EC 5 MG PO TBEC: 5.0000 mg | DELAYED_RELEASE_TABLET | Freq: Every day | ORAL | 0 refills | Status: DC | PRN

## 2021-07-04 NOTE — Addendum Note (Signed)
Addended by: Bradden Tadros, Herbert Seta R on: 07/04/2021 03:09 PM   Modules accepted: Orders

## 2021-07-04 NOTE — Progress Notes (Addendum)
I spoke with Randy Cruz and Elouise Munroe his wife about his recent stay in the Emergency Department at  [facility]. (Patient resides in [place]).  He received a CT of his abdomen ( renal stone protocol)  in wihich  the test did not show a reason for his RUQ pain or his right testicle pain. He declined fluids and pain medication. He stayed about 5 hours. His arrival was 4:30 in the morning. Prior to this he laid on his cough and could not eat or drink anything since  10 am. He actually had change in his breathing secondary to pain and he feels like his body and mind were disconnected.  He wanted answers for his pain and he says they could not tell him why he was hurting.  He has seen Surgery for a possible hernia, no hernia found on CT, referred to Urology diagnoised with Prostatitis and placed on Meloxicam and Flomax, told to follow up with PCP and that they could prescribe  for refill of the Flomax. The Meloxicam did not help his RUQ pain  but did help the arthritis in his knees. He then was referred to GI. In the meantime I had a call with patient and decided to do an Ultrasound of his RUQ, no gallstones but patchy areas of wall thickness up to 5.6 mm. Referred to GI for HIDA scan and that is ordered for mid August with GI, they stopped the Meloxicam and started Omeprazole in case of PUD.Marland Kitchen Results listed below. Of ultrasound.  Given Vicodin in ED. Review with patient to take 1/2 at first wait  1-2 hours if needed he may take the other 1/2. Discussed constipation on Opioids. He verbalizes understanding and has no questions at the end of our conversation.  Patient with a history of fingernail infections placed on Bactrim DS in clinic. Tight muscle in abdomen ? Guarding flexeril prescribed. Meds ordered this encounter  Medications   cyclobenzaprine (FLEXERIL) 5 MG tablet    Sig: Take 1/2 to 1 tablet by mouth every 8 hours as needed for side pain    Dispense:  15 tablet    Refill:  1    bisacodyl 5 MG EC tablet    Sig: Take 1 tablet (5 mg total) by mouth daily as needed for moderate constipation. Ask patient if he needs this only fill on his request.    Dispense:  30 tablet    Refill:  0      Referring to [employer]. [email or web address]  She may call me to to discuss the patient  [phone] This visit  1 hour on phone.  CLINICAL DATA:  Right upper quadrant pain   EXAM: ULTRASOUND ABDOMEN LIMITED RIGHT UPPER QUADRANT   COMPARISON:  04/11/2021   FINDINGS: Gallbladder:   Gallbladder is well distended with patchy areas of wall thickness up to 5.6 mm. No pericholecystic fluid is noted negative sonographic Murphy's sign is elicited. These changes may represent chronic cholecystitis. No gallstones are seen.   Common bile duct:   Diameter: 2.2 mm   Liver:   Mild increase in echogenicity is noted consistent with fatty infiltration. No focal mass is seen. Portal vein is patent on color Doppler imaging with normal direction of blood flow towards the liver.   Other: None.   IMPRESSION: Fatty liver.   Patchy areas of gallbladder wall thickening without right upper quadrant pain. No cholelithiasis is noted. These changes may be related to chronic cholecystitis. HIDA scan could  be performed as clinically indicated.     Electronically Signed   By: Alcide Clever M.D.   On: 05/25/2021 15:26

## 2021-07-06 ENCOUNTER — Other Ambulatory Visit: Payer: Self-pay | Admitting: *Deleted

## 2021-07-06 ENCOUNTER — Telehealth: Payer: Self-pay | Admitting: Nurse Practitioner

## 2021-07-06 DIAGNOSIS — R1084 Generalized abdominal pain: Secondary | ICD-10-CM

## 2021-07-06 DIAGNOSIS — R1011 Right upper quadrant pain: Secondary | ICD-10-CM

## 2021-07-06 NOTE — Telephone Encounter (Signed)
Spoke with patient has now decided to proceed with blood work. Nothing further needed at this time.

## 2021-07-06 NOTE — Telephone Encounter (Signed)
Results encounter. Patients lab orders have been placed. Attempted to contact patient, no answer LMTCB

## 2021-07-06 NOTE — Progress Notes (Signed)
ATC patient, no answer. LMTCB Order for bilirubin has been placed Information for possible Hematology referral has been printed and given to [facility]

## 2021-07-06 NOTE — Telephone Encounter (Signed)
Spoke with patient. Explained to patient why bilirubin was needing to be checked. Per patient "I have had high bilirubin for years" Patient unsure to why bilirubin being checked again, the bilirubing is "hereditary." Per patient he wants to hold off on labwork and just continue with HIDA scan.

## 2021-07-07 ENCOUNTER — Other Ambulatory Visit: Payer: Self-pay

## 2021-07-07 ENCOUNTER — Other Ambulatory Visit (INDEPENDENT_AMBULATORY_CARE_PROVIDER_SITE_OTHER): Payer: BC Managed Care – PPO

## 2021-07-07 ENCOUNTER — Encounter (HOSPITAL_COMMUNITY)
Admission: RE | Admit: 2021-07-07 | Discharge: 2021-07-07 | Disposition: A | Discharge status: Home / Self Care | Payer: BC Managed Care – PPO | Source: Ambulatory Visit | Attending: Nurse Practitioner | Admitting: Nurse Practitioner

## 2021-07-07 ENCOUNTER — Other Ambulatory Visit: Payer: Self-pay | Admitting: *Deleted

## 2021-07-07 DIAGNOSIS — R1011 Right upper quadrant pain: Secondary | ICD-10-CM | POA: Diagnosis not present

## 2021-07-07 DIAGNOSIS — R1012 Left upper quadrant pain: Secondary | ICD-10-CM | POA: Diagnosis not present

## 2021-07-07 DIAGNOSIS — R1084 Generalized abdominal pain: Secondary | ICD-10-CM

## 2021-07-07 LAB — BILIRUBIN, TOTAL: Total Bilirubin: 2.1 mg/dL — ABNORMAL HIGH (ref 0.2–1.2)

## 2021-07-07 MED ORDER — TECHNETIUM TC 99M MEBROFENIN IV KIT
7.9000 | PACK | Freq: Once | INTRAVENOUS | Status: AC
  Administered 2021-07-07: 7.9 via INTRAVENOUS

## 2021-07-13 ENCOUNTER — Encounter (HOSPITAL_COMMUNITY): Payer: BC Managed Care – PPO

## 2021-07-14 DIAGNOSIS — M792 Neuralgia and neuritis, unspecified: Secondary | ICD-10-CM | POA: Diagnosis not present

## 2021-07-14 DIAGNOSIS — R0781 Pleurodynia: Secondary | ICD-10-CM | POA: Diagnosis not present

## 2021-07-14 DIAGNOSIS — R109 Unspecified abdominal pain: Secondary | ICD-10-CM | POA: Diagnosis not present

## 2021-07-14 DIAGNOSIS — R634 Abnormal weight loss: Secondary | ICD-10-CM | POA: Diagnosis not present

## 2021-07-18 ENCOUNTER — Telehealth: Payer: Self-pay

## 2021-07-18 ENCOUNTER — Other Ambulatory Visit: Payer: Self-pay | Admitting: Internal Medicine

## 2021-07-18 DIAGNOSIS — R0781 Pleurodynia: Secondary | ICD-10-CM

## 2021-07-18 NOTE — Telephone Encounter (Signed)
-----   Message from Meredith Pel, NP sent at 07/17/2021  4:26 PM EDT ----- Waynetta Sandy, please offer him a EGD.  The pain that he is having in the RUQ which radiates down to his testicle is not typically related to something that we would find on an EGD but we are happy to proceed. It would be with Dr Orvan Falconer.   Thanks  ----- Message ----- From: Evalee Jefferson, LPN Sent: 3/53/2992   3:21 PM EDT To: Meredith Pel, NP  Gunnar Fusi Dr Orvan Falconer responded in a way that I cannot see her comments.  ----- Message ----- From: Meredith Pel, NP Sent: 07/17/2021  12:30 PM EDT To: Evalee Jefferson, LPN  Beth,  Mr. Randy Cruz was asking about the next step for his RUQ pain that does not sound GI related.  As promised, I asked Dr. Orvan Falconer to review the case and her thoughts are as below: Please offer patient an EGD but most of things we would be evaluating for would not typically cause the pain he is describing.  Thanks

## 2021-07-18 NOTE — Telephone Encounter (Signed)
Spoke with the patient. He is interested in having the EGD.  Appointments arranged. Pre-visit 08/08/21 at 8:30 am EGD 08/16/21 at 10:00 am Denies cardiac concerns, supplemental O2 and anti-coagulation therapy

## 2021-08-08 ENCOUNTER — Other Ambulatory Visit: Payer: Self-pay

## 2021-08-08 ENCOUNTER — Ambulatory Visit (AMBULATORY_SURGERY_CENTER): Payer: Self-pay | Admitting: *Deleted

## 2021-08-08 VITALS — Ht 69.0 in | Wt 151.0 lb

## 2021-08-08 DIAGNOSIS — R1011 Right upper quadrant pain: Secondary | ICD-10-CM

## 2021-08-08 DIAGNOSIS — R1084 Generalized abdominal pain: Secondary | ICD-10-CM

## 2021-08-08 NOTE — Progress Notes (Signed)
Patient & wife is here in-person for PV. Patient denies any allergies to eggs or soy. Patient denies any problems with anesthesia/sedation. Patient denies any oxygen use at home. Patient denies taking any diet/weight loss medications or blood thinners. Patient is aware of our care-partner policy and Covid-19 safety protocol.   Patient is COVID-19 vaccinated. EGD pamphlet given to pt.

## 2021-08-10 ENCOUNTER — Ambulatory Visit
Admission: RE | Admit: 2021-08-10 | Discharge: 2021-08-10 | Disposition: A | Discharge status: Home / Self Care | Payer: BC Managed Care – PPO | Source: Ambulatory Visit | Attending: Internal Medicine | Admitting: Internal Medicine

## 2021-08-10 ENCOUNTER — Other Ambulatory Visit: Payer: Self-pay

## 2021-08-10 ENCOUNTER — Encounter: Payer: Self-pay | Admitting: Gastroenterology

## 2021-08-10 DIAGNOSIS — R918 Other nonspecific abnormal finding of lung field: Secondary | ICD-10-CM | POA: Diagnosis not present

## 2021-08-10 DIAGNOSIS — R911 Solitary pulmonary nodule: Secondary | ICD-10-CM | POA: Diagnosis not present

## 2021-08-10 DIAGNOSIS — J9811 Atelectasis: Secondary | ICD-10-CM | POA: Diagnosis not present

## 2021-08-10 DIAGNOSIS — R079 Chest pain, unspecified: Secondary | ICD-10-CM | POA: Diagnosis not present

## 2021-08-10 DIAGNOSIS — R0781 Pleurodynia: Secondary | ICD-10-CM

## 2021-08-15 ENCOUNTER — Telehealth: Payer: Self-pay | Admitting: Gastroenterology

## 2021-08-15 NOTE — Telephone Encounter (Signed)
CT reviewed. I agree with her caution. Please cancel his procedure. Can be rescheduled after cardiac evaluation and clearance. Thank you.

## 2021-08-15 NOTE — Telephone Encounter (Signed)
Patients wife called states the patient had a CT scan with abnormal findings he currently has a referral to see cardiologist. Not sure if he can still proceed with procedure tomorrow. Please advise.

## 2021-08-15 NOTE — Telephone Encounter (Signed)
Pt given Dr. Orvan Falconer recommendations. Procedure was canceled for tomorrow. Pt made aware.

## 2021-08-16 ENCOUNTER — Encounter: Payer: BC Managed Care – PPO | Admitting: Gastroenterology

## 2021-08-17 ENCOUNTER — Ambulatory Visit: Payer: BC Managed Care – PPO | Admitting: Cardiology

## 2021-08-17 ENCOUNTER — Telehealth: Payer: Self-pay | Admitting: Gastroenterology

## 2021-08-17 ENCOUNTER — Encounter: Payer: Self-pay | Admitting: Cardiology

## 2021-08-17 ENCOUNTER — Other Ambulatory Visit: Payer: Self-pay

## 2021-08-17 VITALS — BP 150/90 | HR 65 | Ht 69.0 in | Wt 150.4 lb

## 2021-08-17 DIAGNOSIS — Z01810 Encounter for preprocedural cardiovascular examination: Secondary | ICD-10-CM | POA: Diagnosis not present

## 2021-08-17 DIAGNOSIS — I77810 Thoracic aortic ectasia: Secondary | ICD-10-CM

## 2021-08-17 DIAGNOSIS — R011 Cardiac murmur, unspecified: Secondary | ICD-10-CM

## 2021-08-17 DIAGNOSIS — I341 Nonrheumatic mitral (valve) prolapse: Secondary | ICD-10-CM | POA: Diagnosis not present

## 2021-08-17 HISTORY — DX: Thoracic aortic ectasia: I77.810

## 2021-08-17 NOTE — Patient Instructions (Addendum)
    Other Instructions  With no active ongoing cardiac symptoms of chest pain or pressure/shortness of breath with rest or exertion, there is no cardiac reason for you not to undergo an endoscopy procedure -> you would be a low risk patient for low risk procedure..  There is no indication for additional cardiac evaluation.  However, since you do have a heart murmur we are ordering a 2D echocardiogram just for clarification purposes.  This should not have any bearing on your endoscopy.  The CT scan does show mild increased diameter of your thoracic aorta as it comes out of the heart.  The upper limit of normal diameter of the aorta for men is 3.8 cm.  Yours was read as 4.1.  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  It becomes a concern for potential treatment over 5 cm.  As such, this is not a current risk for any upcoming procedures.  We will simply reassess the aorta with a CT scan in 1 year.  Also, the echocardiogram will give [place] a different measuring technique which could potentially allow [place] to alternate between CT scan and echo to minimize radiation.  We will see back in 6 months to discuss how frequently need to follow-up chest CT and any baseline risk modification to prevent further progression.   Bryan Lemma, MD    Medication Instructions:  Continue same medications  *If you need a refill on your cardiac medications before your next appointment, please call your pharmacy*   Lab Work: None ordered   Testing/Procedures: Echo   Follow-Up: At [facility], you and your health needs are our priority.  As part of our continuing mission to provide you with exceptional heart care, we have created designated Provider Care Teams.  These Care Teams include your primary Cardiologist (physician) and Advanced Practice Providers (APPs -  Physician Assistants and Nurse Practitioners) who all work together to provide you with the care you need, when you need it.  We recommend signing up for  the patient portal called "MyChart".  Sign up information is provided on this After Visit Summary.  MyChart is used to connect with patients for Virtual Visits (Telemedicine).  Patients are able to view lab/test results, encounter notes, upcoming appointments, etc.  Non-urgent messages can be sent to your provider as well.   To learn more about what you can do with MyChart, go to [email or web address].    Your next appointment:  Monday 02/12/22 at 8:40 am    The format for your next appointment: Office   Provider:  Dr.Harding

## 2021-08-17 NOTE — Telephone Encounter (Signed)
Pt notified that we have the documentation in Epic from his Cardiac visit, pt notified that we will be in touch. Pt verbalized understanding
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
## 2021-08-17 NOTE — Telephone Encounter (Signed)
Inbound call from pt's spouse Darl Pikes stated that she has the pt's cardiac clearance and wanted to know if she needed to drop it off with Dr. Orvan Falconer so the pt can be scheduled for his procedure. Please advise. Thank you.

## 2021-08-18 ENCOUNTER — Other Ambulatory Visit: Payer: Self-pay

## 2021-08-18 DIAGNOSIS — R1011 Right upper quadrant pain: Secondary | ICD-10-CM

## 2021-08-18 DIAGNOSIS — R1012 Left upper quadrant pain: Secondary | ICD-10-CM

## 2021-08-18 DIAGNOSIS — R1084 Generalized abdominal pain: Secondary | ICD-10-CM

## 2021-08-18 NOTE — Telephone Encounter (Signed)
Called pt and rescheduled EGD in [facility] to 08/30/21 @ 1:30pm with arrival time of 12:30pm. New amb referral placed to ensure procedure is auth'd. Also reviewed prep instructions with pt to ensure he is NPO beginning at 1030am until start of procedure. Reviewed list of permitted clear liquids. Pt expressed complete understanding of his prep instructions.

## 2021-08-18 NOTE — Telephone Encounter (Signed)
Below is documented clearance that can be located within Epic:  Other Instructions   With no active ongoing cardiac symptoms of chest pain or pressure/shortness of breath with rest or exertion, there is no cardiac reason for you not to undergo an endoscopy procedure -> you would be a low risk patient for low risk procedure..  There is no indication for additional cardiac evaluation.  However, since you do have a heart murmur we are ordering a 2D echocardiogram just for clarification purposes.  This should not have any bearing on your endoscopy.

## 2021-08-19 ENCOUNTER — Encounter: Payer: Self-pay | Admitting: Cardiology

## 2021-08-19 DIAGNOSIS — Z01810 Encounter for preprocedural cardiovascular examination: Secondary | ICD-10-CM

## 2021-08-19 DIAGNOSIS — I341 Nonrheumatic mitral (valve) prolapse: Secondary | ICD-10-CM | POA: Insufficient documentation

## 2021-08-19 HISTORY — DX: Encounter for preprocedural cardiovascular examination: Z01.810

## 2021-08-19 NOTE — Assessment & Plan Note (Addendum)
Previously reported anterior and posterior leaflet prolapse with mild MR.  Mitral regurgitation murmur seems louder today.  Thankfully, no symptoms noted. \ He does have mitral regurgitation murmur with history of MVP.  We will check a 2D echocardiogram just to stratify.  This should not preclude him going forward with his GI procedure.  We are simply just getting a new baseline.

## 2021-08-19 NOTE — Assessment & Plan Note (Signed)
Upper limit of thoracic aortic diameter for man is 36 to 38 mm.  By CT scan his aorta is estimated at 41 mm which does not meet criteria for aneurysm.  This is simply dilation of the aorta and saphenectomy watched intermittently. Mainstay treatment is maintaining adequate blood pressure control as well as lipid management.\  We will be checking a 2D echocardiogram to reassess his mitral valve which can also allow [place] to measure his aorta.  This will allow [place] to potentially alternate echocardiogram and CT scan assessment of the aorta.  This should in no way affect his going forward with any surgeries or procedures.

## 2021-08-19 NOTE — Assessment & Plan Note (Signed)
In the past, did not seem to have a significant murmur but now he has at least 2 if not 3 out of 6 HSM consistent with MR.  CT scan suggest increased cardiac chamber sizes.  Thankfully, he is totally asymptomatic with no dyspnea or heart failure symptoms.  For sake of completion, we will check 2D echo just to reevaluate the severity of MVP/MR.  Based on the recent changes to the SBE prophylaxis guidelines, would not need antibiotic prophylaxis in the absence of surgical repair of the valve.

## 2021-08-19 NOTE — Assessment & Plan Note (Addendum)
EGD procedure canceled because of CT scan findings.  There is no findings on the CT scan that require further cardiac evaluation.  The aorta is minimally dilated and will be followed serially with either CT scan or echocardiogram.  He is completely asymptomatic walking 3 to 4 miles a day on the [place] without any dyspnea or chest pain.  No ischemic evaluation required.  Would recommend proceeding with any necessary studies and procedures without any further cardiac evaluation.  Echocardiogram was ordered, and will be followed up on-(in the absence of symptoms, would not consider any further evaluation for surgical repair).  However, this should not change alter his appropriateness for any upcoming procedures.

## 2021-08-23 ENCOUNTER — Other Ambulatory Visit: Payer: Self-pay | Admitting: Medical

## 2021-08-23 DIAGNOSIS — N50811 Right testicular pain: Secondary | ICD-10-CM

## 2021-08-24 ENCOUNTER — Telehealth: Payer: Self-pay

## 2021-08-24 DIAGNOSIS — Z23 Encounter for immunization: Secondary | ICD-10-CM | POA: Diagnosis not present

## 2021-08-24 DIAGNOSIS — R1011 Right upper quadrant pain: Secondary | ICD-10-CM

## 2021-08-24 DIAGNOSIS — R1084 Generalized abdominal pain: Secondary | ICD-10-CM

## 2021-08-24 DIAGNOSIS — R1012 Left upper quadrant pain: Secondary | ICD-10-CM

## 2021-08-24 NOTE — Addendum Note (Signed)
Addended by: Deon Pilling J on: 08/24/2021 09:12 AM   Modules accepted: Orders

## 2021-08-24 NOTE — Telephone Encounter (Signed)
UPDATE - procedure time has changed at [facility]. Pt NOW scheduled 10/12/21 @ 845am with arrival time of 715am. Prep instructions reflect this change.  New amb referral placed for newly scheduled procedure, location/date/time change d/t difficult airway. Per pt request, prep instructions have been mailed to pt home address. Strongly reminded pt NOT to arrive at the [facility] as previously scheduled as this safety concern will prevent him from having his procedure on an OP basis. Verbalized complete acceptance and understanding of this information.

## 2021-08-24 NOTE — Telephone Encounter (Signed)
Called pt to make him aware of the need to cancel procedure at [facility] and that it will need rescheduled to [facility] d/t difficult airways as found by Cathlyn Parsons, CRNA. Pt was not happy when informed about the reason for cancellation/rescheduling. Provide pt with Jonny Ruiz Nulty's contact # so that he may further inquire about what has been found by [facility] anesthesia group. Pt agreeable to being rescheduled to [facility] on 10/12/21 @ 945am

## 2021-08-24 NOTE — Telephone Encounter (Signed)
-----   Message from Tressia Danas, MD sent at 08/24/2021  6:35 AM EDT ----- Regarding: FW: Procedure will need to be scheduled at the hospital. May schedule with the first available MD as my hospital time is extremely limited.  KLB ----- Message ----- From: Cathlyn Parsons, CRNA Sent: 08/24/2021   6:32 AM EDT To: Tressia Danas, MD  Dr. Orvan Falconer,  This pt is scheduled with you on 08/30/21.  On 08/01/2016 it was determined he was a difficult airway; consequently his procedure will need to be done at the hospital.  Thanks,  Cathlyn Parsons

## 2021-08-25 NOTE — Telephone Encounter (Signed)
Awaiting response from Dr. Orvan Falconer to determine if any other providers may have a sooner availability AND are willing to perform the procedure.

## 2021-08-25 NOTE — Telephone Encounter (Signed)
Inbound call from patient requesting a call back to discuss procedure appt at [facility] with Dr. Orvan Falconer.

## 2021-08-25 NOTE — Telephone Encounter (Signed)
Returned call. Wife is insistent that her husband MUST be have his procedure done URGENTLY given the fact that his procedure has been rescheduled from the [facility] to [facility]. Advised the pt has been rescheduled to the soonest appt available for Dr. Orvan Falconer at [facility]. Wife expressed frustration about pt appt having been canceled/rescheduled x2. Advised the 1st cancellation resulted in need for cardiac clearance while the 2nd cancellation resulted from anesthesia having found that pt had a difficult airway. Given these safety concerns, pt care has been of our utmost concern. Since pt has been rescheduled to Dr. Orvan Falconer soonest availability, IF has a change in condition, he may proceed to the ED or UC for care where gastroenterologists can intervene urgently, OR may call our office for triage to triage his symptoms to determine if there are any supportive recommendations or appts that can be offered until his procedure. Wife expressed that she is not satisfied with any of these recommendations, then hung up the phone. Routing this message to Dr. Orvan Falconer for her to discuss with another provider whom may have an early opening willing to perform the case.

## 2021-08-25 NOTE — Telephone Encounter (Signed)
Unfortunately, given Dr. Orvan Falconer availabilities and the increased number of pts needing rescheduled to the hospital, this is the soonest hospital date available at this time.

## 2021-08-25 NOTE — Telephone Encounter (Signed)
Inbound call from pt's wife requesting a call back stating that she is concerned about her husband and not wanting to wait until November for his procedure. I did advise her on the previous note that was written. Please advise. Thank you.

## 2021-08-28 NOTE — Telephone Encounter (Signed)
Happy to help. Please offer my date for the patient. Thanks. GM

## 2021-08-28 NOTE — Telephone Encounter (Signed)
Ammie, please forward case to next available yellow block MD to review and advise if they can accommodate the procedure on their schedule

## 2021-08-28 NOTE — Telephone Encounter (Signed)
Reviewed providers schedules for available hosp dates and times. Will forward this message for providers to review pt chart and for them to decide if they are willing to perform case at a sooner date/time per pt request.  Dr. Russella Dar - you have an availability on 11/1 @ 1015am Dr. Marina Goodell - you have an availability on 10/20 @ 1130am (uncertain if you will permit case to run slightly over) Dr. Meridee Score - you have an availability on 10/25 @ 915am   Current case scheduled:  Case Information  Case ID: [id number] Patient name: Randy Cruz, Randy Cruz" [[id number]]  Date: 10/12/2021 Status: Scheduled  Time: 0845 Length (mins): 60  Surgeons: Tressia Danas, MD [phone] Procedure(s): ESOPHAGOGASTRODUODENOSCOPY (EGD) WITH PROPOFOL [1607]  Service: Gastroenterology Location: [facility]  Room: [facility] ROOM 1 Priority:    Patient class: Ambulatory Surgery Case type:    Diagnosis code(s):   Pre-op diagnosis: Abd pain  Requested by:   Requester phone #:    Confirmed? No [0] Referral ID: [id number]

## 2021-08-28 NOTE — Telephone Encounter (Signed)
Pt wife returned call. Offered for pt to be rescheduled to Dr. Meridee Score on 10/25 @ 915am. While on the phone, pt wife is asking for supportive measures to help with epigastric pain and reflux. States his discomfort is located in the epigastric region and can feeling stabbing at times. Denies having any concerns with reflux at night but states his reflux is worse first thing in the morning. Has altered diet to eliminate spicy foods, carbonated beverages, artifical sweeteners and milk/dairy products. Reviewed pt medication list. Appears pt is NOT taking any medications, including OTC. Confirmed with wife who also confirmed pt does NOT take any medications. Routing this message to Dr. Orvan Falconer to inquire if we can start pt on PPI and possibility to send Rx for Bentyl to help with abd discomfort. Will await her response.  At the time of rescheduling procedure with [facility], 915am procedure slot is no longer available, but rather 11am. Pt has now been rescheduled to 09/26/21 @ 11am rather than 915am. New amb referral placed to reflect change in provider, date and time.

## 2021-08-28 NOTE — Telephone Encounter (Signed)
LVM requesting returned call. With any luck, these dates/times will remain open while awaiting pt returned call.

## 2021-08-28 NOTE — Telephone Encounter (Signed)
Awaiting responses from Dr.'s Russella Dar and Mansouraty, pending these dates and times are still available.

## 2021-08-28 NOTE — Telephone Encounter (Signed)
OK for EGD with me on 11/1 @ 1015 unless the patient prefers 10/25 @ 0915 and GM is agreeable.

## 2021-08-28 NOTE — Addendum Note (Signed)
Addended by: Peterson Ao, Asir Bingley J on: 08/28/2021 04:46 PM   Modules accepted: Orders

## 2021-08-29 ENCOUNTER — Other Ambulatory Visit: Payer: Self-pay

## 2021-08-29 DIAGNOSIS — R1013 Epigastric pain: Secondary | ICD-10-CM

## 2021-08-29 DIAGNOSIS — R1084 Generalized abdominal pain: Secondary | ICD-10-CM

## 2021-08-29 MED ORDER — PANTOPRAZOLE SODIUM 40 MG PO TBEC: 40.0000 mg | DELAYED_RELEASE_TABLET | Freq: Two times a day (BID) | ORAL | 2 refills | Status: DC

## 2021-08-29 MED ORDER — DICYCLOMINE HCL 20 MG PO TABS: 20.0000 mg | ORAL_TABLET | Freq: Four times a day (QID) | ORAL | 2 refills | Status: DC | PRN

## 2021-08-29 NOTE — Telephone Encounter (Signed)
LVM requesting returned call. Plan to provide nursing considerations for abd pain and reflux, in addition to informing about new orders. Also planning to inform about change to time of procedure. At the time of conversation, another staff member had scheduled another pt for the time I had offered to pt. This resulted in inability to schedule at the originally offered time. Date remains unchanged. Will inquire if he and his wife prefer updated prep instructions with new date/time OR if they prefer to change current prep instructions to reflect new date/time/location. Will await returned call. In the meantime, per Dr. Orvan Falconer orders, following Rx's sent to pt pharmacy:  Outpatient Medication Detail   Disp Refills Start End   pantoprazole (PROTONIX) 40 MG tablet 60 tablet 2 08/29/2021    Sig - Route: Take 1 tablet (40 mg total) by mouth 2 (two) times daily. - Oral   Sent to pharmacy as: pantoprazole (PROTONIX) 40 MG tablet   E-Prescribing Status: Receipt confirmed by pharmacy (08/29/2021  8:01 AM EDT)    Outpatient Medication Detail   Disp Refills Start End   dicyclomine (BENTYL) 20 MG tablet 30 tablet 2 08/29/2021    Sig - Route: Take 1 tablet (20 mg total) by mouth 4 (four) times daily as needed for spasms. - Oral   Sent to pharmacy as: dicyclomine (BENTYL) 20 MG tablet   E-Prescribing Status: Receipt confirmed by pharmacy (08/29/2021  8:01 AM EDT)    Pharmacy  [id number] [place], [place] - [address]  [address], [place] [address]  Phone:  [phone]  Fax:  [phone]  DEA #:  [id number]

## 2021-08-29 NOTE — Telephone Encounter (Signed)
Pt returned call. Informed about new orders as well as change to time of procedure. Verbalized acceptance and understanding. Declined my offer to re-print instructions with new date/time/location/provider. States he will change this information on his current instructions.

## 2021-08-30 ENCOUNTER — Encounter: Payer: BC Managed Care – PPO | Admitting: Gastroenterology

## 2021-09-01 ENCOUNTER — Ambulatory Visit (HOSPITAL_COMMUNITY): Payer: BC Managed Care – PPO | Attending: Cardiology

## 2021-09-01 ENCOUNTER — Other Ambulatory Visit: Payer: Self-pay

## 2021-09-01 DIAGNOSIS — I340 Nonrheumatic mitral (valve) insufficiency: Secondary | ICD-10-CM | POA: Insufficient documentation

## 2021-09-01 DIAGNOSIS — R011 Cardiac murmur, unspecified: Secondary | ICD-10-CM | POA: Diagnosis not present

## 2021-09-01 DIAGNOSIS — I77810 Thoracic aortic ectasia: Secondary | ICD-10-CM | POA: Insufficient documentation

## 2021-09-01 HISTORY — DX: Nonrheumatic mitral (valve) insufficiency: I34.0

## 2021-09-01 HISTORY — PX: TRANSTHORACIC ECHOCARDIOGRAM: SHX275

## 2021-09-01 LAB — ECHOCARDIOGRAM COMPLETE
Area-P 1/2: 5.13 cm2
MV M vel: 4.07 m/s
MV Peak grad: 66.2 mmHg
S' Lateral: 2.5 cm

## 2021-09-04 NOTE — Progress Notes (Signed)
Discussed with CVTS Team:  Eugenie Birks so he needs a Mini MVR.  Right now please refer him to Dr. Zebedee Iba at [facility].  He is seeing several MR patients for [facility] and that is who we would refer to when Dr. Cornelius Moras was out sick or something.   I would have all tests done and send discs and things to him there.  You can check with [facility] on if he has a persons number he uses to refer.   Thanks,  Darius Bump, RN    Otherwise, if he wishes to stay local - can schedule with one of our surgeons.  Bryan Lemma, MD

## 2021-09-05 ENCOUNTER — Telehealth: Payer: Self-pay | Admitting: *Deleted

## 2021-09-05 NOTE — Telephone Encounter (Signed)
Left message for patient to call back .  About result of  echo and follow up appointment

## 2021-09-05 NOTE — Telephone Encounter (Signed)
-----   Message from Marykay Lex, MD sent at 09/01/2021 12:52 PM EDT ----- Echocardiogram results: Normal heart pump function with ejection fraction in the upper limit of normal 65 to 70%.  Borderline hyperdynamic.  No wall motion normalities to suggest prior injury.  Left atrium is severely dilated, and the mitral valve has severe regurgitation with severe prolapse and possible flail leaflet.  Difficult to quantify.  Mildly elevated pulmonary artery pressures as well as central venous pressure.  Recently, there is no suggestion of aortic dilation.Marland Kitchen  He actually has an EGD scheduled for 09/26/2021. ->  He probably needs a TEE to evaluate the mitral valve better.  We probably should see if we can get this done prior to his EGD for clarification sake -> truth be told, at this level of mitral disease, he probably needs referral for surgical consult.  Would like to get him scheduled for TEE as well as right and left heart cath, but he probably needs to be brought in to discuss the results.  I can potentially discuss this with him on my virtual day next week.   I still think he is fine having an EGD, but we should get the stuff done before the EGD is scheduled.  Bryan Lemma, MD

## 2021-09-07 DIAGNOSIS — Z23 Encounter for immunization: Secondary | ICD-10-CM | POA: Diagnosis not present

## 2021-09-11 ENCOUNTER — Encounter: Payer: Self-pay | Admitting: Cardiology
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
## 2021-09-12 ENCOUNTER — Ambulatory Visit: Payer: BC Managed Care – PPO | Admitting: Cardiology

## 2021-09-12 ENCOUNTER — Telehealth: Payer: Self-pay | Admitting: Cardiology

## 2021-09-12 ENCOUNTER — Other Ambulatory Visit: Payer: Self-pay

## 2021-09-12 ENCOUNTER — Encounter: Payer: Self-pay | Admitting: Cardiology

## 2021-09-12 ENCOUNTER — Encounter: Payer: Self-pay | Admitting: *Deleted

## 2021-09-12 VITALS — BP 110/54 | HR 60 | Ht 69.0 in | Wt 150.4 lb

## 2021-09-12 DIAGNOSIS — Z01810 Encounter for preprocedural cardiovascular examination: Secondary | ICD-10-CM | POA: Diagnosis not present

## 2021-09-12 DIAGNOSIS — I77810 Thoracic aortic ectasia: Secondary | ICD-10-CM | POA: Diagnosis not present

## 2021-09-12 DIAGNOSIS — I340 Nonrheumatic mitral (valve) insufficiency: Secondary | ICD-10-CM | POA: Diagnosis not present

## 2021-09-12 DIAGNOSIS — E785 Hyperlipidemia, unspecified: Secondary | ICD-10-CM

## 2021-09-12 DIAGNOSIS — I341 Nonrheumatic mitral (valve) prolapse: Secondary | ICD-10-CM

## 2021-09-12 LAB — BASIC METABOLIC PANEL
BUN/Creatinine Ratio: 24 (ref 10–24)
BUN: 18 mg/dL (ref 8–27)
CO2: 26 mmol/L (ref 20–29)
Calcium: 9.3 mg/dL (ref 8.6–10.2)
Chloride: 103 mmol/L (ref 96–106)
Creatinine, Ser: 0.74 mg/dL — ABNORMAL LOW (ref 0.76–1.27)
Glucose: 132 mg/dL — ABNORMAL HIGH (ref 70–99)
Potassium: 4.5 mmol/L (ref 3.5–5.2)
Sodium: 141 mmol/L (ref 134–144)
eGFR: 102 mL/min/{1.73_m2} (ref >59)

## 2021-09-12 LAB — CBC
Hematocrit: 43.7 % (ref 37.5–51.0)
Hemoglobin: 15.3 g/dL (ref 13.0–17.7)
MCH: 31.7 pg (ref 26.6–33.0)
MCHC: 35 g/dL (ref 31.5–35.7)
MCV: 91 fL (ref 79–97)
Platelets: 127 x10E3/uL — ABNORMAL LOW (ref 150–450)
RBC: 4.83 x10E6/uL (ref 4.14–5.80)
RDW: 12.9 % (ref 11.6–15.4)
WBC: 4 x10E3/uL (ref 3.4–10.8)

## 2021-09-12 MED ORDER — AZITHROMYCIN 200 MG/5ML PO SUSR: ORAL | 0 refills | Status: DC

## 2021-09-12 MED ORDER — AMOXICILLIN 500 MG PO TABS: 2000.0000 mg | ORAL_TABLET | Freq: Once | ORAL | 0 refills | Status: DC

## 2021-09-12 NOTE — Telephone Encounter (Signed)
Spoke with the patient. He stated that Dr. Daleen Bo office called and stated that they were canceling the endoscopy unless they hear directly from Dr. Herbie Baltimore. That office does have access to epic.   Endoscopy scheduled for 10/25. The patient would like a call back when this has been taken care of.

## 2021-09-12 NOTE — Patient Instructions (Addendum)
Medication Instructions:   Take 2,000 gm  ( 4 tablet of 500 mg) of Amoxicillin 30 to 60 min prior to EGD on 09/26/21  *If you need a refill on your cardiac medications before your next appointment, please call your pharmacy*   Lab Work:  Bmp Cbc- today  If you have labs (blood work) drawn today and your tests are completely normal, you will receive your results only by: MyChart Message (if you have MyChart) OR A paper copy in the mail If you have any lab test that is abnormal or we need to change your treatment, we will call you to review the results.   Testing/Procedures:  Both are schedule on Oct 04, 2022 at [facility]  Your physician has requested that you have a TEE. During a TEE, sound waves are used to create images of your heart. It provides your doctor with information about the size and shape of your heart and how well your heart's chambers and valves are working. In this test, a transducer is attached to the end of a flexible tube that's guided down your throat and into your esophagus (the tube leading from you mouth to your stomach) to get a more detailed image of your heart. You are not awake for the procedure. Please see the instruction sheet given to you today. For further information please visit [email or web address]   And Your physician has requested that you have a cardiac catheterization. Cardiac catheterization is used to diagnose and/or treat various heart conditions. Doctors may recommend this procedure for a number of different reasons. The most common reason is to evaluate chest pain. Chest pain can be a symptom of coronary artery disease (CAD), and cardiac catheterization can show whether plaque is narrowing or blocking your heart's arteries. This procedure is also used to evaluate the valves, as well as measure the blood flow and oxygen levels in different parts of your heart. For further information please visit [email or web address] Please follow instruction sheet, as  given.   Follow-Up: At [facility], you and your health needs are our priority.  As part of our continuing mission to provide you with exceptional heart care, we have created designated Provider Care Teams.  These Care Teams include your primary Cardiologist (physician) and Advanced Practice Providers (APPs -  Physician Assistants and Nurse Practitioners) who all work together to provide you with the care you need, when you need it.  We recommend signing up for the patient portal called "MyChart".  Sign up information is provided on this After Visit Summary.  MyChart is used to connect with patients for Virtual Visits (Telemedicine).  Patients are able to view lab/test results, encounter notes, upcoming appointments, etc.  Non-urgent messages can be sent to your provider as well.   To learn more about what you can do with MyChart, go to [email or web address].    Your next appointment:   4  to 5 weeks  The format for your next appointment:   In Person  Provider:   Bryan Lemma, MD   Other Instructions   You are scheduled for a TEE on Oct 04, 2021 with Dr. Royann Cruz.  Please arrive at the [facility] (Main Entrance A) at [facility]: [address] [place], [place] [address] at 8 am DIET: Nothing to eat or drink after midnight except a sip of water with medications (see medication instructions below)  FYI: For your safety, and to allow [place] to monitor your vital signs accurately during the surgery/procedure we request that  if you have artificial nails, gel coating, SNS etc. Please have those removed prior to your surgery/procedure. Not having the nail coverings /polish removed may result in cancellation or delay of your surgery/procedure.   Medication Instructions: No changes     Labs: BMP, CBC-- today Oct 04, 2021    You must have a responsible person to drive you home and stay in the waiting area during your procedure. Failure to do so could result in  cancellation.  Bring your insurance cards.  *Special Note: Every effort is made to have your procedure done on time. Occasionally there are emergencies that occur at the hospital that may cause delays. Please be patient if a delay does occur.         [name] CARDIOVASCULAR DIVISION [facility] NORTHLINE [address] [place] [address] Dept: [phone] Loc: [phone]  Randy Cruz  09/12/2021  You are scheduled for a Cardiac Catheterization on Wednesday, November 2 with Dr. Bryan Cruz.  1. Please arrive at the [facility] (Main Entrance A) at [facility]: [address] [place], [place] [address] at 8:00 AM (This time is two hours before your procedure to ensure your preparation). Free valet parking service is available.   Special note: Every effort is made to have your procedure done on time. Please understand that emergencies sometimes delay scheduled procedures.  2. Diet: Do not eat solid foods after midnight.  The patient may have clear liquids until 5am upon the day of the procedure.  3. Labs: You will need to have blood drawn on , today CBC,BMP  4. Medication instructions in preparation for your procedure:   Contrast Allergy: No   On the morning of your procedure, take your Aspirin  81 mg and any morning medicines NOT listed above.  You may use sips of water.  5. Plan for one night stay--bring personal belongings. 6. Bring a current list of your medications and current insurance cards. 7. You MUST have a responsible person to drive you home. 8. Someone MUST be with you the first 24 hours after you arrive home or your discharge will be delayed. 9. Please wear clothes that are easy to get on and off and wear slip-on shoes.  Thank you for allowing [place] to care for you!   -- [place] [facility]

## 2021-09-12 NOTE — Telephone Encounter (Signed)
He called saying Dr. Herbie Baltimore gave him the okay for his endoscope. But then the office of Dr. Daleen Bo called him saying Dr. Herbie Baltimore said to cancel the endoscope.  He wants to get is clarified.

## 2021-09-12 NOTE — Telephone Encounter (Signed)
Clarified via telephone call with Dr. Orvan Falconer.  There is no need to to delay endoscopy procedure prior to having right left heart cath done with TEE.  This is simply part of preop evaluation for mitral valve prolapse and regurgitation.  The patient is totally asymptomatic and therefore mitral valve procedure is not urgent.  We are planning to proceed with TEE and remembering cath 1 week out this way any GI procedures are completed before going forward with evaluation.  To clarify the previous comment, I did not think it would be feasible or possible to attempt to get the TEE cardiac catheterization scheduled prior to the EGD.  I did not however feel that doing a similar procedure to TEE would be that much of a risk.  He is completely asymptomatic.  Walks a little miles a day without any dyspnea.  No heart failure symptoms. As such, he is fully compensated granuloma annulare, and we are not sure of the acuity of his valvular disease.  Last evaluation was 4 years ago.  The intention for proceeding with preoperative evaluation is that we need to complete the work-up prior to referring to cardiac surgery at [facility].  There should be no reason to delay his EGD.  The patient himself is more interested in the results of his EGD and evaluation of abdominal pain that he has about his mitral valve.   Bryan Lemma, MD

## 2021-09-13 ENCOUNTER — Encounter: Payer: Self-pay | Admitting: Cardiology

## 2021-09-13 ENCOUNTER — Encounter: Payer: Self-pay | Admitting: *Deleted

## 2021-09-13 DIAGNOSIS — E785 Hyperlipidemia, unspecified: Secondary | ICD-10-CM

## 2021-09-13 HISTORY — DX: Hyperlipidemia, unspecified: E78.5

## 2021-09-13 NOTE — Assessment & Plan Note (Signed)
Notable progression of disease from previous echo.  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  Severe MR.  Will initiate preoperative evaluation with TEE & RLHC. Will communicate with [facility] re: additional pre-op studies.  With Severe MR & MVP - recommend SBE Prophylaxis for upcoming EGD -- Rx provided (wanted liquid formulation)

## 2021-09-13 NOTE — Assessment & Plan Note (Addendum)
Significant progression of disease from 2018 now has severe MR with what appears to be a flail posterior leaflet.  Thankfully, his blood pressure is well controlled as is his heart rate and he is not on any medications.  I do not think we need to add anything at this point.  Somehow he is asymptomatic which is very reassuring.  He is walking several miles a day without any dyspnea.  We had a long time talking about the severity of the MRI and that there is a high potential for him to have a significant symptomatic breakthrough if this is not dealt with.  The best time to do something would be while he is still doing well.  He seems to be much more focused on his abdominal pain and GI issues.  I think at this point it is definitely reasonable for him to proceed with his planned EGD on October 25.  I communicated this with Dr. Orvan Falconer.  This is a same procedure that would be performed as a TEE.  We will schedule TEE and right and left heart catheterization for November 1 or 2 as part of preop mitral valve surgery.  This will need to be put together in a CD as we may need to refer to [facility] - provided no evidence of CAD, he would potentially be a good candidate for mini MVR -current plans are to refer to Dr Zebedee Iba from [place].   I reviewed with him concerning symptoms as well as the risk, benefits alternatives indications of both TEE and RLHC.  Shared Decision Making/Informed Consent The risks [stroke (1 in 1000), death (1 in 1000), kidney failure [usually temporary] (1 in 500), bleeding (1 in 200), allergic reaction [possibly serious] (1 in 200)], benefits (diagnostic support and management of coronary artery disease) and alternatives of a cardiac catheterization were discussed in detail with Mr. Gasparyan and he is willing to proceed.   The risks [esophageal damage, perforation (1:10,000 risk), bleeding, pharyngeal hematoma as well as other potential complications associated with conscious sedation including  aspiration, arrhythmia, respiratory failure and death], benefits (treatment guidance and diagnostic support) and alternatives of a transesophageal echocardiogram were discussed in detail with Mr. Haroon and he is willing to proceed.

## 2021-09-13 NOTE — Telephone Encounter (Signed)
Thanks for update.  I will see the patient for his upcoming EGD.

## 2021-09-13 NOTE — Assessment & Plan Note (Addendum)
He does have severe mitral prolapse and mitral valve regurgitation.  However he is totally asymptomatic. He has an EGD planned which is essentially the same procedure as TEE and therefore there should be no reason for him to be delayed having this procedure done which has been scheduled well in advance.  This issue needs to be clarified prior to undergoing preop evaluation for mitral valve surgery.  Would prefer to have GI procedure done prior to mitral valve repair to avoid SBE.  However, since there is significant mitral valve damage, I do think is not unreasonable to prophylactically treat with antibiotics for SBE prophylaxis for upcoming EGD.Marland Kitchen  azithromycin (ZITHROMAX) 200 MG/5ML suspension Take 12.5 ml (2.5 teaspoonsful) by mouth 30-60 minutes prior to EGD

## 2021-09-13 NOTE — Telephone Encounter (Signed)
My pleasure --   [facility]

## 2021-09-13 NOTE — Telephone Encounter (Signed)
Spoke to patient on 10 /10/22. Patient was seen by Dr Herbie Baltimore  on 09/13/21 to discussresults

## 2021-09-13 NOTE — Assessment & Plan Note (Signed)
Total cholesterol 208 with LDL of 137.   This will probably need to be addressed in the future, but for now we will hold off until his GI issues are resolved.  Would not want to add a medication with him having strange symptoms. He will be evaluated with a cardiac catheterization to see if there is any evidence of CAD.  If so, would be more aggressive, and treat accordingly.

## 2021-09-13 NOTE — Assessment & Plan Note (Signed)
Not commented on with current echocardiogram.  In the absence of any active aortic disease, unlikely that this would get any bigger.  We can continue to monitor, but I doubt that it would grow.

## 2021-09-19 ENCOUNTER — Other Ambulatory Visit: Payer: Self-pay | Admitting: *Deleted

## 2021-09-19 DIAGNOSIS — Z01810 Encounter for preprocedural cardiovascular examination: Secondary | ICD-10-CM

## 2021-09-19 DIAGNOSIS — I340 Nonrheumatic mitral (valve) insufficiency: Secondary | ICD-10-CM

## 2021-09-19 DIAGNOSIS — I341 Nonrheumatic mitral (valve) prolapse: Secondary | ICD-10-CM

## 2021-09-20 ENCOUNTER — Telehealth: Payer: Self-pay | Admitting: Cardiology

## 2021-09-20 ENCOUNTER — Telehealth: Payer: Self-pay | Admitting: Pharmacist

## 2021-09-20 DIAGNOSIS — I341 Nonrheumatic mitral (valve) prolapse: Secondary | ICD-10-CM

## 2021-09-20 MED ORDER — AMOXICILLIN-POT CLAVULANATE 400-57 MG/5ML PO SUSR: 25.0000 mL | Freq: Once | ORAL | 0 refills | Status: AC

## 2021-09-20 NOTE — Telephone Encounter (Signed)
Patient requires 2 grams amoxicllin suspension with no dye.  Pharmacy has 400/5mg  Augmentin in stock.  Dose would be po 1 hour before procedure.

## 2021-09-20 NOTE — Telephone Encounter (Signed)
Spoke to pharmacist  She states  the patient picked up the zithromax- the suspension is pink.  Patient needs a suspension that is dye free.   Per pharmacist what is in stock is Augmentin liquid  Amoxicillin clavulante 400-57 mg /5 ml or Amoxicillin clavulanate 600 mg-42.9 mg /66ml   RN  informed [facility]ist  will contact pharmacy once new direction are obtained from the doctor or [facility] pharmacist. Verbalized understanding.   See telephone note 09/20/21 12:53 pm

## 2021-09-20 NOTE — Telephone Encounter (Signed)
[facility] Pharmacist called to talk with Dr. Elissa Hefty nurse regarding patient

## 2021-09-25 ENCOUNTER — Encounter (HOSPITAL_COMMUNITY): Payer: Self-pay | Admitting: Gastroenterology

## 2021-09-25 ENCOUNTER — Encounter (HOSPITAL_COMMUNITY): Payer: Self-pay | Admitting: Cardiovascular Disease

## 2021-09-26 ENCOUNTER — Encounter (HOSPITAL_COMMUNITY): Payer: Self-pay | Admitting: Gastroenterology

## 2021-09-26 ENCOUNTER — Other Ambulatory Visit: Payer: Self-pay

## 2021-09-26 ENCOUNTER — Ambulatory Visit (HOSPITAL_COMMUNITY)
Admission: RE | Admit: 2021-09-26 | Discharge: 2021-09-26 | Disposition: A | Discharge status: Home / Self Care | Payer: BC Managed Care – PPO | Source: Ambulatory Visit | Attending: Gastroenterology | Admitting: Gastroenterology

## 2021-09-26 ENCOUNTER — Encounter (HOSPITAL_COMMUNITY)
Admission: RE | Disposition: A | Discharge status: Home / Self Care | Payer: Self-pay | Source: Ambulatory Visit | Attending: Gastroenterology

## 2021-09-26 ENCOUNTER — Ambulatory Visit (HOSPITAL_COMMUNITY): Payer: BC Managed Care – PPO | Admitting: Anesthesiology

## 2021-09-26 DIAGNOSIS — K5710 Diverticulosis of small intestine without perforation or abscess without bleeding: Secondary | ICD-10-CM | POA: Diagnosis not present

## 2021-09-26 DIAGNOSIS — K2970 Gastritis, unspecified, without bleeding: Secondary | ICD-10-CM | POA: Insufficient documentation

## 2021-09-26 DIAGNOSIS — K317 Polyp of stomach and duodenum: Secondary | ICD-10-CM | POA: Insufficient documentation

## 2021-09-26 DIAGNOSIS — E785 Hyperlipidemia, unspecified: Secondary | ICD-10-CM | POA: Diagnosis not present

## 2021-09-26 DIAGNOSIS — R933 Abnormal findings on diagnostic imaging of other parts of digestive tract: Secondary | ICD-10-CM | POA: Diagnosis not present

## 2021-09-26 DIAGNOSIS — K259 Gastric ulcer, unspecified as acute or chronic, without hemorrhage or perforation: Secondary | ICD-10-CM | POA: Diagnosis not present

## 2021-09-26 DIAGNOSIS — R109 Unspecified abdominal pain: Secondary | ICD-10-CM | POA: Diagnosis not present

## 2021-09-26 DIAGNOSIS — R634 Abnormal weight loss: Secondary | ICD-10-CM | POA: Insufficient documentation

## 2021-09-26 DIAGNOSIS — Z885 Allergy status to narcotic agent status: Secondary | ICD-10-CM | POA: Insufficient documentation

## 2021-09-26 DIAGNOSIS — R1011 Right upper quadrant pain: Secondary | ICD-10-CM | POA: Diagnosis not present

## 2021-09-26 HISTORY — PX: ESOPHAGOGASTRODUODENOSCOPY (EGD) WITH PROPOFOL: SHX5813

## 2021-09-26 HISTORY — PX: BIOPSY: SHX5522

## 2021-09-26 SURGERY — ESOPHAGOGASTRODUODENOSCOPY (EGD) WITH PROPOFOL
Anesthesia: Monitor Anesthesia Care

## 2021-09-26 MED ORDER — PROPOFOL 500 MG/50ML IV EMUL
INTRAVENOUS | Status: DC | PRN
  Administered 2021-09-26: 30 mg via INTRAVENOUS

## 2021-09-26 MED ORDER — EPHEDRINE SULFATE 50 MG/ML IJ SOLN
INTRAMUSCULAR | Status: DC | PRN
  Administered 2021-09-26 (×2): 5 mg via INTRAVENOUS

## 2021-09-26 MED ORDER — ONDANSETRON HCL 4 MG/2ML IJ SOLN
INTRAMUSCULAR | Status: DC | PRN
  Administered 2021-09-26: 4 mg via INTRAVENOUS

## 2021-09-26 MED ORDER — LACTATED RINGERS IV SOLN: INTRAVENOUS | Status: DC

## 2021-09-26 MED ORDER — PROPOFOL 500 MG/50ML IV EMUL
INTRAVENOUS | Status: AC
  Filled 2021-09-26: qty 50

## 2021-09-26 MED ORDER — PROPOFOL 500 MG/50ML IV EMUL
INTRAVENOUS | Status: DC | PRN
  Administered 2021-09-26: 135 ug/kg/min via INTRAVENOUS

## 2021-09-26 SURGICAL SUPPLY — 14 items

## 2021-09-26 NOTE — Discharge Instructions (Signed)
YOU HAD AN ENDOSCOPIC PROCEDURE TODAY: Refer to the procedure report and other information in the discharge instructions given to you for any specific questions about what was found during the examination. If this information does not answer your questions, please call [place] office at [phone] to clarify.   YOU SHOULD EXPECT: Some feelings of bloating in the abdomen. Passage of more gas than usual. Walking can help get rid of the air that was put into your GI tract during the procedure and reduce the bloating. If you had a lower endoscopy (such as a colonoscopy or flexible sigmoidoscopy) you may notice spotting of blood in your stool or on the toilet paper. Some abdominal soreness may be present for a day or two, also.  DIET: Your first meal following the procedure should be a light meal and then it is ok to progress to your normal diet. A half-sandwich or bowl of soup is an example of a good first meal. Heavy or fried foods are harder to digest and may make you feel nauseous or bloated. Drink plenty of fluids but you should avoid alcoholic beverages for 24 hours.   ACTIVITY: Your care partner should take you home directly after the procedure. You should plan to take it easy, moving slowly for the rest of the day. You can resume normal activity the day after the procedure however YOU SHOULD NOT DRIVE, use power tools, machinery or perform tasks that involve climbing or major physical exertion for 24 hours (because of the sedation medicines used during the test).   SYMPTOMS TO REPORT IMMEDIATELY: A gastroenterologist can be reached at any hour. Please call [phone]  for any of the following symptoms:  Following upper endoscopy (EGD, EUS, ERCP, esophageal dilation) Vomiting of blood or coffee ground material  New, significant abdominal pain  New, significant chest pain or pain under the shoulder blades  Painful or persistently difficult swallowing  New shortness of breath  Black,  tarry-looking or red, bloody stools  FOLLOW UP:  If any biopsies were taken you will be contacted by phone or by letter within the next 1-3 weeks. Call [phone]  if you have not heard about the biopsies in 3 weeks.  Please also call with any specific questions about appointments or follow up tests.  

## 2021-09-26 NOTE — Anesthesia Postprocedure Evaluation (Signed)
Anesthesia Post Note  Patient: Yeshua Stryker  Procedure(s) Performed: ESOPHAGOGASTRODUODENOSCOPY (EGD) WITH PROPOFOL BIOPSY     Patient location during evaluation: Endoscopy Anesthesia Type: MAC Level of consciousness: awake and alert Pain management: pain level controlled Vital Signs Assessment: post-procedure vital signs reviewed and stable Respiratory status: spontaneous breathing, nonlabored ventilation, respiratory function stable and patient connected to nasal cannula oxygen Cardiovascular status: stable and blood pressure returned to baseline Postop Assessment: no apparent nausea or vomiting Anesthetic complications: no   No notable events documented.  Last Vitals:  Vitals:   09/26/21 1110 09/26/21 1111  BP:  (!) 167/92  Pulse: 75 74  Resp: 20 16  Temp:    SpO2: 98% 99%    Last Pain:  Vitals:   09/26/21 1111  TempSrc:   PainSc: 0-No pain                 Belenda Cruise P Abigayl Hor

## 2021-09-26 NOTE — Transfer of Care (Signed)
Immediate Anesthesia Transfer of Care Note  Patient: Amritpal Shropshire  Procedure(s) Performed: Procedure(s): ESOPHAGOGASTRODUODENOSCOPY (EGD) WITH PROPOFOL (N/A) BIOPSY (N/A)  Patient Location: PACU  Anesthesia Type:MAC  Level of Consciousness:  sedated, patient cooperative and responds to stimulation  Airway & Oxygen Therapy:Patient Spontanous Breathing and Patient connected to face mask oxgen  Post-op Assessment:  Report given to PACU RN and Post -op Vital signs reviewed and stable  Post vital signs:  Reviewed and stable  Last Vitals:  Vitals:   09/26/21 0942 09/26/21 1048  BP: (!) 163/88 117/73  Pulse: 71 62  Resp: 17 16  Temp: 36.7 C   SpO2: 353% 91%    Complications: No apparent anesthesia complications

## 2021-09-26 NOTE — Anesthesia Preprocedure Evaluation (Signed)
Anesthesia Evaluation  Patient identified by MRN, date of birth, ID band Patient awake    Reviewed: Allergy & Precautions, NPO status , Patient's Chart, lab work & pertinent test results  Airway Mallampati: II  TM Distance: >3 FB     Dental  (+) Teeth Intact   Pulmonary neg pulmonary ROS,    Pulmonary exam normal        Cardiovascular negative cardio ROS  + Valvular Problems/Murmurs MVP and MR  Rhythm:Regular Rate:Normal + Systolic murmurs    Neuro/Psych negative neurological ROS  negative psych ROS   GI/Hepatic Neg liver ROS, Abdominal pain   Endo/Other  negative endocrine ROS  Renal/GU negative Renal ROS  negative genitourinary   Musculoskeletal  (+) Arthritis , Osteoarthritis,    Abdominal (+)  Abdomen: soft.    Peds  Hematology negative hematology ROS (+)   Anesthesia Other Findings   Reproductive/Obstetrics                             Anesthesia Physical Anesthesia Plan  ASA: 3  Anesthesia Plan: MAC   Post-op Pain Management:    Induction:   PONV Risk Score and Plan: 1 and Propofol infusion and Treatment may vary due to age or medical condition  Airway Management Planned: Simple Face Mask, Natural Airway and Nasal Cannula  Additional Equipment: None  Intra-op Plan:   Post-operative Plan:   Informed Consent: I have reviewed the patients History and Physical, chart, labs and discussed the procedure including the risks, benefits and alternatives for the proposed anesthesia with the patient or authorized representative who has indicated his/her understanding and acceptance.     Dental advisory given  Plan Discussed with: CRNA  Anesthesia Plan Comments: (DOS note: patient with severe MR awaiting further cardiac work-up. Reviewed notes from cardiology Dr. Ellyn Hack. Patient cleared to proceed with endoscopy given functional status and asymptomatic state.  ECHO  09/01/21: 1. Left ventricular ejection fraction, by estimation, is 65 to 70%. The  left ventricle has normal function. The left ventricle has no regional  wall motion abnormalities. There is mild left ventricular hypertrophy.  Left ventricular diastolic parameters  were normal.  2. Right ventricular systolic function is normal. The right ventricular  size is normal. There is mildly elevated pulmonary artery systolic  pressure. The estimated right ventricular systolic pressure is 17.9 mmHg.  3. Left atrial size was severely dilated.  4. The aortic valve is tricuspid. Aortic valve regurgitation is trivial.  No aortic stenosis is present.  5. The inferior vena cava is dilated in size with >50% respiratory  variability, suggesting right atrial pressure of 8 mmHg.  6. The mitral valve is abnormal. Severe mitral valve regurgitation. There  is severe prolapse/possible flail of posterior leaflet of the mitral valve  causing eccentric anterior directed MR jet. Difficult to quantify MR  severity given eccentric jet, but  appears severe with severe LA dilatation and E wave velocity >1.4 m/s.  Recommend TEE for further evaluation )        Anesthesia Quick Evaluation
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
## 2021-09-26 NOTE — Op Note (Signed)
[facility] Patient Name: Randy Cruz Procedure Date: 09/26/2021 MRN: [id number] Attending MD: Justice Britain , MD Date of Birth: 30-Oct-1956 CSN: [id number] Age: 65 Admit Type: Outpatient Procedure:                Upper GI endoscopy Indications:              Chronic Abdominal distress in the right upper                            quadrant (within 40 minutes of eating at times -                            not always), Abnormal CT of the GI tract, Weight                            loss, Some component of early satiety (longer                            standing and eating smaller meals) Providers:                Justice Britain, MD, Burtis Junes, RN, Hinton Dyer Referring MD:             Thornton Park MD, MD, Willia Craze, Dr.                            Sabra Heck Medicines:                Monitored Anesthesia Care Complications:            No immediate complications. Estimated Blood Loss:     Estimated blood loss was minimal. Procedure:                Pre-Anesthesia Assessment:                           - Prior to the procedure, a History and Physical                            was performed, and patient medications and                            allergies were reviewed. The patient's tolerance of                            previous anesthesia was also reviewed. The risks                            and benefits of the procedure and the sedation                            options and risks were discussed with the patient.                            All questions were answered, and informed consent  was obtained. Prior Anticoagulants: The patient has                            taken no previous anticoagulant or antiplatelet                            agents. ASA Grade Assessment: III - A patient with                            severe systemic disease. After reviewing the risks                            and benefits, the patient was  deemed in                            satisfactory condition to undergo the procedure.                           After obtaining informed consent, the endoscope was                            passed under direct vision. Throughout the                            procedure, the patient's blood pressure, pulse, and                            oxygen saturations were monitored continuously. The                            [id number]-H190 ([id number]) Olympus endoscope was introduced                            through the mouth, and advanced to the second part                            of duodenum. The upper GI endoscopy was                            accomplished without difficulty. The patient                            tolerated the procedure. Scope In: Scope Out: Findings:      No gross lesions were noted in the entire esophagus. Biopsies were taken       with a cold forceps for histology to rule out EoE/LoE.      The Z-line was regular and was found 43 cm from the incisors.      A few 2 to 5 mm semi-sessile polyps with no bleeding and no stigmata of       recent bleeding were found in the gastric fundus. Most consistent with       fundic gland polyps. Biopsies were taken with a cold forceps for       histology to rule out adenomatous change.  Patchy mild inflammation characterized by erosions and erythema were       found in the entire examined stomach. Biopsies were taken with a cold       forceps for histology and Helicobacter pylori testing.      A medium non-bleeding diverticulum was found in the second/third portion       of the duodenum.      No other gross lesions were noted in the duodenal bulb, in the first       portion of the duodenum and in the second portion of the duodenum.       Biopsies were taken with a cold forceps for histology. Impression:               - No gross lesions in esophagus. Biopsied.                           - Z-line regular, 43 cm from the incisors.                            - A few gastric polyps - likely fundic gland.                            Biopsied a few.                           - Gastritis. Biopsied.                           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 No                            other gross lesions in the duodenal bulb, in the                            first portion of the duodenum and in the second                            portion of the duodenum. Biopsied. Moderate Sedation:      Not Applicable - Patient had care per Anesthesia. Recommendation:           - The patient will be observed post-procedure,                            until all discharge criteria are met.                           - Discharge patient to home.                           - Patient has a contact number available for                            emergencies. The signs and symptoms of potential                            delayed complications were discussed with  the                            patient. Return to normal activities tomorrow.                            Written discharge instructions were provided to the                            patient.                           - Resume previous diet.                           - Await pathology results.                           - Recommend trialing PPI (Protonix) as previously                            prescribed at least once daily.                           - If PPI isn't effective, although not severe                            gastritis noted could query possible role of                            Carafate trial after PPI.                           - This does not seem to be overly intraluminal                            spasticity though if PPI is not effective, then may                            want to consider trial at time of pain to see if                            this may help.                           - Listening to patient/wife, it is not clear that                             this is a GI etiology for pain. With this being                            said, he has had unintentional weight loss of                            nearly 15 pounds in 6-7 months.  One could query                            role of colonoscopy but he is not having any bowel                            movement changes. His symptoms of eating and                            getting full faster could suggest gastroparesis,                            but he has no nausea or vomiting. Not clear a                            SF-GES would help [place] better understand his pain                            component. He describes some lateral/rotational                            movements that can exacerbate pain but he has a                            negative Carnett sign today.                           - Uploading into Media tab his updated history for                            viewing that was brought in today.                           - Observe patient's clinical course.                           - Return to GI clinic.                           - The findings and recommendations were discussed                            with the patient.                           - The findings and recommendations were discussed                            with the patient's family. Procedure Code(s):        --- Professional ---                           [phone], Esophagogastroduodenoscopy, flexible,  transoral; with biopsy, single or multiple Diagnosis Code(s):        --- Professional ---                           K31.7, Polyp of stomach and duodenum                           K29.70, Gastritis, unspecified, without bleeding                           R10.11, Right upper quadrant pain                           R63.4, Abnormal weight loss                           R93.3, Abnormal findings on diagnostic imaging of                            other parts of digestive tract CPT copyright  2019 American Medical Association. All rights reserved. The codes documented in this report are preliminary and upon coder review may  be revised to meet current compliance requirements. Justice Britain, MD 09/26/2021 10:58:27 AM Number of Addenda: 0

## 2021-09-27 ENCOUNTER — Encounter (HOSPITAL_COMMUNITY): Payer: Self-pay | Admitting: Gastroenterology

## 2021-09-27 ENCOUNTER — Encounter: Payer: Self-pay | Admitting: Gastroenterology

## 2021-09-27 LAB — SURGICAL PATHOLOGY

## 2021-10-03 ENCOUNTER — Telehealth: Payer: Self-pay | Admitting: *Deleted

## 2021-10-03 NOTE — Telephone Encounter (Signed)
Cardiac catheterization scheduled at [facility] for: Wednesday October 04, 2021 11:30 AM/TEE 9 AM [facility]) at: 8 AM   Nothing to eat or drink after midnight, may have sips of water to take medications.   Usual morning medications can be taken pre-cath with sips of water including aspirin 81 mg.    Confirmed patient has responsible adult to drive home post procedure and be with patient first 24 hours after arriving home.  [facility] does allow one visitor to accompany you and wait in the hospital waiting room while you are there for your procedure. You and your visitor will be asked to wear a mask once you enter the hospital.   Patient reports does not currently have any new symptoms concerning for COVID-19 and no household members with COVID-19 like illness.     Reviewed procedure/mask/visitor instructions with patient.

## 2021-10-03 NOTE — Anesthesia Preprocedure Evaluation (Addendum)
Anesthesia Evaluation  Patient identified by MRN, date of birth, ID band Patient awake    Reviewed: Allergy & Precautions, NPO status , Patient's Chart, lab work & pertinent test results  Airway Mallampati: III  TM Distance: >3 FB Neck ROM: Full    Dental no notable dental hx. (+) Teeth Intact, Dental Advisory Given, Poor Dentition   Pulmonary neg pulmonary ROS,    Pulmonary exam normal breath sounds clear to auscultation       Cardiovascular negative cardio ROS Normal cardiovascular exam+ Valvular Problems/Murmurs MVP and MR  Rhythm:Regular Rate:Normal + Systolic murmurs    Neuro/Psych negative neurological ROS  negative psych ROS   GI/Hepatic negative GI ROS, Neg liver ROS, Abdominal pain   Endo/Other  negative endocrine ROS  Renal/GU Renal diseasenegative Renal ROS  negative genitourinary   Musculoskeletal negative musculoskeletal ROS (+) Arthritis , Osteoarthritis,    Abdominal (+)  Abdomen: soft.    Peds negative pediatric ROS (+)  Hematology negative hematology ROS (+)   Anesthesia Other Findings   Reproductive/Obstetrics negative OB ROS                            Anesthesia Physical  Anesthesia Plan  ASA: 3  Anesthesia Plan: MAC   Post-op Pain Management:    Induction:   PONV Risk Score and Plan: 1 and Propofol infusion and Treatment may vary due to age or medical condition  Airway Management Planned: Simple Face Mask, Natural Airway and Nasal Cannula  Additional Equipment: None  Intra-op Plan:   Post-operative Plan:   Informed Consent: I have reviewed the patients History and Physical, chart, labs and discussed the procedure including the risks, benefits and alternatives for the proposed anesthesia with the patient or authorized representative who has indicated his/her understanding and acceptance.     Dental advisory given  Plan Discussed with:  CRNA  Anesthesia Plan Comments: (65 y.o. male who presents for EGD for evaluation of abdominal pain with negative CT scans other than some mild gallbladder distention but negative HIDA. Patient with severe MR awaiting further cardiac work-up. Reviewed notes from cardiology Dr. Ellyn Hack. Patient cleared to proceed with endoscopy given functional status and asymptomatic state.  ECHO 09/01/21: 1. Left ventricular ejection fraction, by estimation, is 65 to 70%. The  left ventricle has normal function. The left ventricle has no regional  wall motion abnormalities. There is mild left ventricular hypertrophy.  Left ventricular diastolic parameters  were normal.  2. Right ventricular systolic function is normal. The right ventricular  size is normal. There is mildly elevated pulmonary artery systolic  pressure. The estimated right ventricular systolic pressure is 20.2 mmHg.  3. Left atrial size was severely dilated.  4. The aortic valve is tricuspid. Aortic valve regurgitation is trivial.  No aortic stenosis is present.  5. The inferior vena cava is dilated in size with >50% respiratory  variability, suggesting right atrial pressure of 8 mmHg.  6. The mitral valve is abnormal. Severe mitral valve regurgitation. There  is severe prolapse/possible flail of posterior leaflet of the mitral valve  causing eccentric anterior directed MR jet. Difficult to quantify MR  severity given eccentric jet, but  appears severe with severe LA dilatation and E wave velocity >1.4 m/s.  Recommend TEE for further evaluation )        Anesthesia Quick Evaluation

## 2021-10-04 ENCOUNTER — Ambulatory Visit (HOSPITAL_COMMUNITY): Payer: BC Managed Care – PPO | Admitting: Anesthesiology

## 2021-10-04 ENCOUNTER — Other Ambulatory Visit: Payer: Self-pay

## 2021-10-04 ENCOUNTER — Ambulatory Visit (HOSPITAL_BASED_OUTPATIENT_CLINIC_OR_DEPARTMENT_OTHER)
Admission: RE | Admit: 2021-10-04 | Discharge: 2021-10-04 | Disposition: A | Discharge status: Home / Self Care | Payer: BC Managed Care – PPO | Source: Home / Self Care | Attending: Cardiology | Admitting: Cardiology

## 2021-10-04 ENCOUNTER — Ambulatory Visit (HOSPITAL_COMMUNITY)
Admission: RE | Admit: 2021-10-04 | Discharge: 2021-10-04 | Disposition: A | Discharge status: Home / Self Care | Payer: BC Managed Care – PPO | Attending: Cardiovascular Disease | Admitting: Cardiovascular Disease

## 2021-10-04 ENCOUNTER — Encounter (HOSPITAL_COMMUNITY)
Admission: RE | Disposition: A | Discharge status: Home / Self Care | Payer: Self-pay | Source: Home / Self Care | Attending: Cardiovascular Disease

## 2021-10-04 ENCOUNTER — Encounter (HOSPITAL_COMMUNITY): Payer: Self-pay | Admitting: Cardiovascular Disease

## 2021-10-04 DIAGNOSIS — E785 Hyperlipidemia, unspecified: Secondary | ICD-10-CM | POA: Diagnosis not present

## 2021-10-04 DIAGNOSIS — Z79899 Other long term (current) drug therapy: Secondary | ICD-10-CM | POA: Insufficient documentation

## 2021-10-04 DIAGNOSIS — I340 Nonrheumatic mitral (valve) insufficiency: Secondary | ICD-10-CM | POA: Diagnosis present

## 2021-10-04 DIAGNOSIS — I77810 Thoracic aortic ectasia: Secondary | ICD-10-CM | POA: Insufficient documentation

## 2021-10-04 DIAGNOSIS — Z885 Allergy status to narcotic agent status: Secondary | ICD-10-CM | POA: Insufficient documentation

## 2021-10-04 DIAGNOSIS — I341 Nonrheumatic mitral (valve) prolapse: Secondary | ICD-10-CM | POA: Diagnosis not present

## 2021-10-04 DIAGNOSIS — L821 Other seborrheic keratosis: Secondary | ICD-10-CM | POA: Diagnosis not present

## 2021-10-04 DIAGNOSIS — I081 Rheumatic disorders of both mitral and tricuspid valves: Secondary | ICD-10-CM | POA: Diagnosis not present

## 2021-10-04 DIAGNOSIS — L300 Nummular dermatitis: Secondary | ICD-10-CM | POA: Diagnosis not present

## 2021-10-04 DIAGNOSIS — Z01810 Encounter for preprocedural cardiovascular examination: Secondary | ICD-10-CM

## 2021-10-04 HISTORY — PX: RIGHT/LEFT HEART CATH AND CORONARY ANGIOGRAPHY: CATH118266

## 2021-10-04 HISTORY — DX: Other complications of anesthesia, initial encounter: T88.59XA

## 2021-10-04 HISTORY — PX: TEE WITHOUT CARDIOVERSION: SHX5443

## 2021-10-04 LAB — POCT I-STAT EG7
Acid-Base Excess: 0 mmol/L (ref 0.0–2.0)
Acid-Base Excess: 0 mmol/L (ref 0.0–2.0)
Bicarbonate: 25.9 mmol/L (ref 20.0–28.0)
Bicarbonate: 26.6 mmol/L (ref 20.0–28.0)
Calcium, Ion: 1.18 mmol/L (ref 1.15–1.40)
Calcium, Ion: 1.17 mmol/L (ref 1.15–1.40)
HCT: 39 % (ref 39.0–52.0)
HCT: 39 % (ref 39.0–52.0)
Hemoglobin: 13.3 g/dL (ref 13.0–17.0)
Hemoglobin: 13.3 g/dL (ref 13.0–17.0)
O2 Saturation: 70 %
O2 Saturation: 73 %
Potassium: 3.4 mmol/L — ABNORMAL LOW (ref 3.5–5.1)
Potassium: 3.4 mmol/L — ABNORMAL LOW (ref 3.5–5.1)
Sodium: 144 mmol/L (ref 135–145)
Sodium: 144 mmol/L (ref 135–145)
TCO2: 27 mmol/L (ref 22–32)
TCO2: 28 mmol/L (ref 22–32)
pCO2, Ven: 46.8 mmHg (ref 44.0–60.0)
pCO2, Ven: 48 mmHg (ref 44.0–60.0)
pH, Ven: 7.35 (ref 7.250–7.430)
pH, Ven: 7.352 (ref 7.250–7.430)
pO2, Ven: 39 mmHg (ref 32.0–45.0)
pO2, Ven: 41 mmHg (ref 32.0–45.0)

## 2021-10-04 LAB — ECHO TEE
MV M vel: 5.04 m/s
MV Peak grad: 101.6 mmHg
P 1/2 time: 47.5 msec
Radius: 1.2 cm

## 2021-10-04 LAB — POCT I-STAT 7, (LYTES, BLD GAS, ICA,H+H)
Acid-Base Excess: 0 mmol/L (ref 0.0–2.0)
Bicarbonate: 25.4 mmol/L (ref 20.0–28.0)
Calcium, Ion: 1.18 mmol/L (ref 1.15–1.40)
HCT: 39 % (ref 39.0–52.0)
Hemoglobin: 13.3 g/dL (ref 13.0–17.0)
O2 Saturation: 99 %
Potassium: 3.5 mmol/L (ref 3.5–5.1)
Sodium: 143 mmol/L (ref 135–145)
TCO2: 27 mmol/L (ref 22–32)
pCO2 arterial: 43 mmHg (ref 32.0–48.0)
pH, Arterial: 7.378 (ref 7.350–7.450)
pO2, Arterial: 146 mmHg — ABNORMAL HIGH (ref 83.0–108.0)

## 2021-10-04 SURGERY — ECHOCARDIOGRAM, TRANSESOPHAGEAL
Anesthesia: Monitor Anesthesia Care

## 2021-10-04 SURGERY — RIGHT/LEFT HEART CATH AND CORONARY ANGIOGRAPHY
Anesthesia: LOCAL

## 2021-10-04 MED ORDER — ONDANSETRON HCL 4 MG/2ML IJ SOLN: 4.0000 mg | Freq: Four times a day (QID) | INTRAMUSCULAR | Status: DC | PRN

## 2021-10-04 MED ORDER — MIDAZOLAM HCL 2 MG/2ML IJ SOLN
INTRAMUSCULAR | Status: AC
  Filled 2021-10-04: qty 2

## 2021-10-04 MED ORDER — SODIUM CHLORIDE 0.9 % WEIGHT BASED INFUSION: 3.0000 mL/kg/h | INTRAVENOUS | Status: AC

## 2021-10-04 MED ORDER — PROPOFOL 10 MG/ML IV BOLUS
INTRAVENOUS | Status: DC | PRN
  Administered 2021-10-04 (×2): 20 mg via INTRAVENOUS

## 2021-10-04 MED ORDER — LIDOCAINE HCL (PF) 1 % IJ SOLN
INTRAMUSCULAR | Status: DC | PRN
  Administered 2021-10-04: 2 mL via INTRADERMAL
  Administered 2021-10-04: 3 mL via INTRADERMAL

## 2021-10-04 MED ORDER — ACETAMINOPHEN 325 MG PO TABS: 650.0000 mg | ORAL_TABLET | ORAL | Status: DC | PRN

## 2021-10-04 MED ORDER — SODIUM CHLORIDE 0.9% FLUSH: 3.0000 mL | INTRAVENOUS | Status: DC | PRN

## 2021-10-04 MED ORDER — HEPARIN SODIUM (PORCINE) 1000 UNIT/ML IJ SOLN
INTRAMUSCULAR | Status: AC
  Filled 2021-10-04: qty 1

## 2021-10-04 MED ORDER — HEPARIN SODIUM (PORCINE) 1000 UNIT/ML IJ SOLN
INTRAMUSCULAR | Status: DC | PRN
  Administered 2021-10-04: 3500 [IU] via INTRAVENOUS

## 2021-10-04 MED ORDER — IOHEXOL 350 MG/ML SOLN
INTRAVENOUS | Status: DC | PRN
  Administered 2021-10-04: 30 mL

## 2021-10-04 MED ORDER — HEPARIN (PORCINE) IN NACL 1000-0.9 UT/500ML-% IV SOLN
INTRAVENOUS | Status: AC
  Filled 2021-10-04: qty 1000

## 2021-10-04 MED ORDER — LABETALOL HCL 5 MG/ML IV SOLN: 10.0000 mg | INTRAVENOUS | Status: DC | PRN

## 2021-10-04 MED ORDER — PROPOFOL 500 MG/50ML IV EMUL
INTRAVENOUS | Status: DC | PRN
  Administered 2021-10-04: 125 ug/kg/min via INTRAVENOUS

## 2021-10-04 MED ORDER — LIDOCAINE HCL (PF) 1 % IJ SOLN
INTRAMUSCULAR | Status: AC
  Filled 2021-10-04: qty 30

## 2021-10-04 MED ORDER — SODIUM CHLORIDE 0.9 % IV SOLN
INTRAVENOUS | Status: DC
  Administered 2021-10-04: 20 mL/h via INTRAVENOUS

## 2021-10-04 MED ORDER — SODIUM CHLORIDE 0.9% FLUSH: 3.0000 mL | Freq: Two times a day (BID) | INTRAVENOUS | Status: DC

## 2021-10-04 MED ORDER — SODIUM CHLORIDE 0.9 % IV SOLN: 250.0000 mL | INTRAVENOUS | Status: DC | PRN

## 2021-10-04 MED ORDER — HYDRALAZINE HCL 20 MG/ML IJ SOLN: 10.0000 mg | INTRAMUSCULAR | Status: DC | PRN

## 2021-10-04 MED ORDER — SODIUM CHLORIDE 0.9 % IV SOLN: INTRAVENOUS | Status: DC

## 2021-10-04 MED ORDER — MIDAZOLAM HCL 2 MG/2ML IJ SOLN
INTRAMUSCULAR | Status: DC | PRN
  Administered 2021-10-04: 2 mg via INTRAVENOUS

## 2021-10-04 MED ORDER — VERAPAMIL HCL 2.5 MG/ML IV SOLN
INTRAVENOUS | Status: DC | PRN
  Administered 2021-10-04: 10 mL via INTRA_ARTERIAL

## 2021-10-04 MED ORDER — SODIUM CHLORIDE 0.9 % WEIGHT BASED INFUSION: 1.0000 mL/kg/h | INTRAVENOUS | Status: DC

## 2021-10-04 MED ORDER — LIDOCAINE 2% (20 MG/ML) 5 ML SYRINGE
INTRAMUSCULAR | Status: DC | PRN
  Administered 2021-10-04: 100 mg via INTRAVENOUS

## 2021-10-04 MED ORDER — VERAPAMIL HCL 2.5 MG/ML IV SOLN
INTRAVENOUS | Status: AC
  Filled 2021-10-04: qty 2

## 2021-10-04 MED ORDER — HEPARIN (PORCINE) IN NACL 1000-0.9 UT/500ML-% IV SOLN
INTRAVENOUS | Status: DC | PRN
  Administered 2021-10-04 (×3): 500 mL

## 2021-10-04 SURGICAL SUPPLY — 12 items
CATH BALLN WEDGE 5F 110CM (CATHETERS) ×2 IMPLANT
CATH OPTITORQUE TIG 4.0 5F (CATHETERS) ×2 IMPLANT
DEVICE RAD COMP TR BAND LRG (VASCULAR PRODUCTS) ×2 IMPLANT
GLIDESHEATH SLEND SS 6F .021 (SHEATH) ×2 IMPLANT
GUIDEWIRE INQWIRE 1.5J.035X260 (WIRE) ×1 IMPLANT
INQWIRE 1.5J .035X260CM (WIRE) ×2
KIT HEART LEFT (KITS) ×2 IMPLANT
PACK CARDIAC CATHETERIZATION (CUSTOM PROCEDURE TRAY) ×2 IMPLANT
SHEATH GLIDE SLENDER 4/5FR (SHEATH) ×2 IMPLANT
SHEATH PROBE COVER 6X72 (BAG) ×2 IMPLANT
TRANSDUCER W/STOPCOCK (MISCELLANEOUS) ×2 IMPLANT
TUBING CIL FLEX 10 FLL-RA (TUBING) ×2 IMPLANT

## 2021-10-04 NOTE — Anesthesia Postprocedure Evaluation (Signed)
Anesthesia Post Note  Patient: Randy Cruz  Procedure(s) Performed: TRANSESOPHAGEAL ECHOCARDIOGRAM (TEE)     Anesthesia Post Evaluation No notable events documented.  Last Vitals:  Vitals:   10/04/21 1049 10/04/21 1118  BP: (!) 145/84 139/68  Pulse: (!) 58 (!) 57  Resp: 16 16  Temp:  36.7 C  SpO2: 99% 99%    Last Pain:  Vitals:   10/04/21 1118  TempSrc: Oral  PainSc: 0-No pain                 Taelyn Nemes

## 2021-10-04 NOTE — Interval H&P Note (Signed)
History and Physical Interval Note:  10/04/2021 9:57 AM  Randy Cruz  has presented today for surgery, with the diagnosis of abnormal echo.  The various methods of treatment have been discussed with the patient and family. After consideration of risks, benefits and other options for treatment, the patient has consented to  Procedure(s): RIGHT/LEFT HEART CATH AND CORONARY ANGIOGRAPHY (N/A) as a surgical intervention.  The patient's history has been reviewed, patient examined, no change in status, stable for surgery.  I have reviewed the patient's chart and labs.  Questions were answered to the patient's satisfaction.     Bryan Lemma

## 2021-10-04 NOTE — Progress Notes (Signed)
Pt transported to cath lab.  

## 2021-10-04 NOTE — Anesthesia Procedure Notes (Signed)
Procedure Name: MAC Date/Time: 10/04/2021 9:02 AM Performed by: Amadeo Garnet, CRNA Pre-anesthesia Checklist: Patient identified, Emergency Drugs available and Suction available Patient Re-evaluated:Patient Re-evaluated prior to induction Oxygen Delivery Method: Nasal cannula Preoxygenation: Pre-oxygenation with 100% oxygen Induction Type: IV induction Placement Confirmation: positive ETCO2 Dental Injury: Teeth and Oropharynx as per pre-operative assessment

## 2021-10-04 NOTE — Op Note (Signed)
INDICATIONS: Mitral insufficiency  PROCEDURE:   Informed consent was obtained prior to the procedure. The risks, benefits and alternatives for the procedure were discussed and the patient comprehended these risks.  Risks include, but are not limited to, cough, sore throat, vomiting, nausea, somnolence, esophageal and stomach trauma or perforation, bleeding, low blood pressure, aspiration, pneumonia, infection, trauma to the teeth and death.    After a procedural time-out, the oropharynx was anesthetized with 20% benzocaine spray.   During this procedure the patient was administered iv propofol by Anesthesiology (Dr. Tacy Dura).  The transesophageal probe was inserted in the esophagus and stomach without difficulty and multiple views were obtained.  The patient was kept under observation until the patient left the procedure room.  The patient left the procedure room in stable condition.   Agitated microbubble saline contrast was not administered.  COMPLICATIONS:    There were no immediate complications. Did not cross the probe intro the stomach following recent EGD/polyp resection/gastritis.  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 There is severe mitral insufficiency. The largest MR jet is due to flail motion of the middle (P2) cusp of the posterior leaflet, with an eccentric jet directed anteriorly and towards the septum. There is a second important jet directed towards and hugging the posterior wall of the left atrium. Dilated left atrium. Normal left ventricular systolic function. Myxomatous tricuspid valve with mild prolapse and mild TR. No pericardial effusion. Normal aorta.  RECOMMENDATIONS:     Proceed with cardiac cath before surgical mitral valve repair.  Time Spent Directly with the Patient:  30 minutes   Randy Cruz 10/04/2021, 9:19 AM

## 2021-10-04 NOTE — Transfer of Care (Signed)
Immediate Anesthesia Transfer of Care Note  Patient: Randy Cruz  Procedure(s) Performed: TRANSESOPHAGEAL ECHOCARDIOGRAM (TEE)  Patient Location: Endoscopy Unit  Anesthesia Type:MAC  Level of Consciousness: drowsy and patient cooperative  Airway & Oxygen Therapy: Patient Spontanous Breathing and Patient connected to nasal cannula oxygen  Post-op Assessment: Report given to RN, Post -op Vital signs reviewed and stable and Patient moving all extremities  Post vital signs: Reviewed and stable  Last Vitals:  Vitals Value Taken Time  BP 124/75 10/04/21 0928  Temp    Pulse 29 10/04/21 0929  Resp 0 10/04/21 0929  SpO2 98 % 10/04/21 0929  Vitals shown include unvalidated device data.  Last Pain:  Vitals:   10/04/21 0830  TempSrc: Temporal  PainSc: 0-No pain         Complications: No notable events documented.

## 2021-10-04 NOTE — Anesthesia Postprocedure Evaluation (Signed)
Anesthesia Post Note  Patient: Randy Cruz  Procedure(s) Performed: TRANSESOPHAGEAL ECHOCARDIOGRAM (TEE)     Patient location during evaluation: PACU Anesthesia Type: MAC Level of consciousness: awake and alert Pain management: pain level controlled Vital Signs Assessment: post-procedure vital signs reviewed and stable Respiratory status: spontaneous breathing, nonlabored ventilation, respiratory function stable and patient connected to nasal cannula oxygen Cardiovascular status: stable and blood pressure returned to baseline Postop Assessment: no apparent nausea or vomiting Anesthetic complications: no   No notable events documented.  Last Vitals:  Vitals:   10/04/21 1049 10/04/21 1118  BP: (!) 145/84 139/68  Pulse: (!) 58 (!) 57  Resp: 16 16  Temp:  36.7 C  SpO2: 99% 99%    Last Pain:  Vitals:   10/04/21 1118  TempSrc: Oral  PainSc: 0-No pain                 [name]

## 2021-10-04 NOTE — Interval H&P Note (Signed)
History and Physical Interval Note:  10/04/2021 8:23 AM  Randy Cruz  has presented today for surgery, with the diagnosis of ABNORMAL ECHOCARDIOGRAM.  The various methods of treatment have been discussed with the patient and family. After consideration of risks, benefits and other options for treatment, the patient has consented to  Procedure(s): TRANSESOPHAGEAL ECHOCARDIOGRAM (TEE) (N/A) as a surgical intervention.  The patient's history has been reviewed, patient examined, no change in status, stable for surgery.  I have reviewed the patient's chart and labs.  Questions were answered to the patient's satisfaction.     Endora Teresi

## 2021-10-05 ENCOUNTER — Encounter (HOSPITAL_COMMUNITY): Payer: Self-pay | Admitting: Cardiovascular Disease

## 2021-10-06 ENCOUNTER — Telehealth: Payer: Self-pay | Admitting: Cardiology

## 2021-10-06 NOTE — Telephone Encounter (Signed)
Informed pt that Dr. Herbie Baltimore has the CD and will give it to them at OV on 11/8. Pt verbalized understanding.

## 2021-10-06 NOTE — Telephone Encounter (Signed)
Patient's wife states they were told after his Cath they would be handed a CD and everything would be on it. She says he had his cath done Wednesday, but they did not receive the CD. She also says the phone number is a land line. She says they have no cell phone and they patient does not have mychart and while our office does leave voicemail's, other offices have not left messages. She says she is not sure if [facility] will know to leave a message, because some other offices just hang up and are not allowed to leave messages. She would like to make sure [facility] will know to leave a message so they can answer the phone.

## 2021-10-09 ENCOUNTER — Encounter: Payer: Self-pay | Admitting: Cardiology
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
## 2021-10-10 ENCOUNTER — Telehealth: Payer: Self-pay | Admitting: *Deleted

## 2021-10-10 ENCOUNTER — Ambulatory Visit (INDEPENDENT_AMBULATORY_CARE_PROVIDER_SITE_OTHER): Payer: BC Managed Care – PPO | Admitting: Cardiology

## 2021-10-10 ENCOUNTER — Other Ambulatory Visit: Payer: Self-pay

## 2021-10-10 ENCOUNTER — Encounter: Payer: Self-pay | Admitting: Cardiology

## 2021-10-10 VITALS — BP 142/82 | HR 68 | Ht 69.0 in | Wt 151.8 lb

## 2021-10-10 DIAGNOSIS — I341 Nonrheumatic mitral (valve) prolapse: Secondary | ICD-10-CM

## 2021-10-10 DIAGNOSIS — I340 Nonrheumatic mitral (valve) insufficiency: Secondary | ICD-10-CM | POA: Diagnosis not present

## 2021-10-10 DIAGNOSIS — E785 Hyperlipidemia, unspecified: Secondary | ICD-10-CM | POA: Diagnosis not present

## 2021-10-10 DIAGNOSIS — I77810 Thoracic aortic ectasia: Secondary | ICD-10-CM | POA: Diagnosis not present

## 2021-10-10 MED ORDER — LOSARTAN POTASSIUM 25 MG PO TABS: 12.5000 mg | ORAL_TABLET | Freq: Every day | ORAL | 3 refills | Status: DC

## 2021-10-10 NOTE — Patient Instructions (Addendum)
Medication Instructions:   Losartan  12.5 mg ( 1/2 tablet of 25 mg ) daily   *If you need a refill on your cardiac medications before your next appointment, please call your pharmacy*   Lab Work:  BMP in one week after starting  Losartan  If you have labs (blood work) drawn today and your tests are completely normal, you will receive your results only by: MyChart Message (if you have MyChart) OR A paper copy in the mail If you have any lab test that is abnormal or we need to change your treatment, we will call you to review the results.   Testing/Procedures: Not needed   Follow-Up: At [facility], you and your health needs are our priority.  As part of our continuing mission to provide you with exceptional heart care, we have created designated Provider Care Teams.  These Care Teams include your primary Cardiologist (physician) and Advanced Practice Providers (APPs -  Physician Assistants and Nurse Practitioners) who all work together to provide you with the care you need, when you need it.     Your next appointment:   3 month(s)  The format for your next appointment:   In Person  Provider:   Dr Bryan Lemma   Other Instructions  You have been referred to  Dr  Ron Parker   at [facility] - discuss  mitral val surgery

## 2021-10-10 NOTE — Telephone Encounter (Signed)
Called an spoke to Argyle at Dr Thea Silversmith office.  [phone]  Referral information for consultation for possible mitral valve surgery    Per  [facility] ,she was able to get information  from care everywhere.  Need recent office note 10/10/21.  Patient has had TEE, right and left heart cath -she states she was able to see information in question.   She will contact  [facility] and have information "powershare"thru Epic to [facility].   Reuel Boom states she will contact patient .

## 2021-10-11 ENCOUNTER — Encounter: Payer: Self-pay | Admitting: Cardiology

## 2021-10-11 NOTE — Assessment & Plan Note (Signed)
TEE confirmed the severity of the aortic valve with what appears to flail leaflets 1 major and at least 1 if not 2 other jets detected different ways based on leaflet prolapse.  Thankfully, cardiac catheterization showed relatively normal coronary and right heart cath numbers.  No evidence of pulmonary hypertension, or significantly elevated filling pressures.  Cardiac output is however reduced.  The extent that MR with MVP and chordal rupture with flail leaflets warrants referral for Cardiothoracic Surgery -> he will be referred to Dr. Zebedee Iba of [facility] Cardiac Surgery due to lack of local mitral valve surgeon for minimally invasive valve surgery.  The patient and family were provided CDs of both transthoracic and transesophageal echocardiogram evaluations as well as cardiac catheterization.  If additional copies are needed for Dr. Zebedee Iba - they can be burned.   I spent almost an hour in addition to the time I spent after his catheterization discussing the findings and the ramifications of the status going forward with the patient and his wife.  I explained everything at least 2 or 3 times including the need to refer outside of our facility.  I also reiterated that because it is outside of our facility, I do not have any idea as the timing of this will be done.  I have sent an email to [facility] & he graciously responded.  We have also formally put the referral in place.  Plan:  Refer to [facility] cardiac surgery  Add low-dose losartan-12.5 mg for afterload reduction.  As soon as I mentioned adding medication, he came up with reasons for not taking them.  I explained the need for regular reduction.  Thankfully, he remains relatively asymptomatic and is as usual perseverating more on his right lower quadrant pain than anything else.

## 2021-10-11 NOTE — Assessment & Plan Note (Signed)
Randy Cruz possible discussion about regurgitation I also explained much of a prolapse.  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 At this since he treated with afterload reduction and referral to surgery.  The decision is the timing of your continued surgery will be left up to Dr. Zebedee Iba when he sees Randy Cruz in Consultation.   Adding low dose Losartan for mild afterload reduction -- (decided on 12.5 mg b/c despite BP today, he mentioned lightheadedness / dizziness (orthostatic) as a reason not to take any medication.

## 2021-10-11 NOTE — Assessment & Plan Note (Signed)
Lipids are not controlled.  He should be on therapy, but with all of his GI and abdominal illness issues as well as aches and pains, I would prefer to avoid planning statin now until he has his valve operated on.

## 2021-10-11 NOTE — Assessment & Plan Note (Signed)
Minimal dilation noted on CT scan.  Should not be a major issue for upcoming surgery although he may very well have additional studies to reevaluate.

## 2021-10-20 DIAGNOSIS — I340 Nonrheumatic mitral (valve) insufficiency: Secondary | ICD-10-CM | POA: Diagnosis not present

## 2021-10-20 DIAGNOSIS — I341 Nonrheumatic mitral (valve) prolapse: Secondary | ICD-10-CM | POA: Diagnosis not present

## 2021-10-20 LAB — BASIC METABOLIC PANEL
BUN/Creatinine Ratio: 22 (ref 10–24)
BUN: 19 mg/dL (ref 8–27)
CO2: 27 mmol/L (ref 20–29)
Calcium: 9.3 mg/dL (ref 8.6–10.2)
Chloride: 104 mmol/L (ref 96–106)
Creatinine, Ser: 0.85 mg/dL (ref 0.76–1.27)
Glucose: 132 mg/dL — ABNORMAL HIGH (ref 70–99)
Potassium: 4.4 mmol/L (ref 3.5–5.2)
Sodium: 144 mmol/L (ref 134–144)
eGFR: 98 mL/min/{1.73_m2} (ref >59)

## 2021-10-23 ENCOUNTER — Encounter: Payer: Self-pay | Admitting: *Deleted

## 2021-11-16 DIAGNOSIS — Z9889 Other specified postprocedural states: Secondary | ICD-10-CM

## 2021-11-16 HISTORY — PX: MITRAL VALVE REPAIR: SHX2039

## 2021-11-16 HISTORY — PX: TEE WITHOUT CARDIOVERSION: SHX5443

## 2021-11-16 HISTORY — DX: Other specified postprocedural states: Z98.890

## 2021-11-22 ENCOUNTER — Telehealth: Payer: Self-pay | Admitting: Cardiology

## 2021-11-22 DIAGNOSIS — Z952 Presence of prosthetic heart valve: Secondary | ICD-10-CM

## 2021-11-22 DIAGNOSIS — I341 Nonrheumatic mitral (valve) prolapse: Secondary | ICD-10-CM

## 2021-11-22 DIAGNOSIS — I340 Nonrheumatic mitral (valve) insufficiency: Secondary | ICD-10-CM

## 2021-11-22 NOTE — Telephone Encounter (Signed)
Spoke to patient wife. She states patient recently discharge from [facility] ( yesterday) . Surgery was 11/16/21.  Wife states since patient does not live in [place].  [place] would like for patient to be set up for cardiac rehab  in [place].  Wife states patient walked 2 miles  the  day prior to discharge.  RN informed patient  an order  will sent to Cardiac rehab  at [facility] . It is a 12 week program . Patient can attend the the whole program or part depending on situation.   The program will call and given detail on orientation and cost .  Wife wanted to know where could the patient walk if it is cold outside.  -  large dept stores or try an indoor mall,or gym like [employer]  Wife verbalized understanding  Follow up appointment schedule for Dec 27, 2020 with Dr Herbie Baltimore.

## 2021-11-22 NOTE — Telephone Encounter (Signed)
Patient's wife states the patient just had surgery at [facility] and they requested she call the office to start cardiac rehab. She says he would need the short version and requests something inside so he would not have to walk in the very cold weather. She says it is okay to leave a detailed voice mail.

## 2021-11-22 NOTE — Telephone Encounter (Signed)
Very happy to hear that he has completed his surgery and he is back home.  We can send off referral to cardiac rehab.  Agree that he does not have complete the whole course.  I agree with recommendations for walking at gym, [employer] or indoor mall.  Even large indoor stores such as [employer] and [employer] are good places to take walks.   Bryan Lemma, MD

## 2021-11-24 ENCOUNTER — Other Ambulatory Visit: Payer: Self-pay | Admitting: Gastroenterology

## 2021-11-24 DIAGNOSIS — R1013 Epigastric pain: Secondary | ICD-10-CM

## 2021-12-08 ENCOUNTER — Telehealth: Payer: Self-pay | Admitting: Cardiology

## 2021-12-08 NOTE — Telephone Encounter (Signed)
Did not need this encounter °

## 2021-12-27 ENCOUNTER — Other Ambulatory Visit: Payer: Self-pay

## 2021-12-27 ENCOUNTER — Ambulatory Visit: Payer: BC Managed Care – PPO | Admitting: Cardiology

## 2021-12-27 ENCOUNTER — Encounter: Payer: Self-pay | Admitting: Cardiology

## 2021-12-27 VITALS — BP 148/82 | HR 80 | Ht 69.0 in | Wt 149.8 lb

## 2021-12-27 DIAGNOSIS — E785 Hyperlipidemia, unspecified: Secondary | ICD-10-CM

## 2021-12-27 DIAGNOSIS — I340 Nonrheumatic mitral (valve) insufficiency: Secondary | ICD-10-CM | POA: Diagnosis not present

## 2021-12-27 DIAGNOSIS — I77810 Thoracic aortic ectasia: Secondary | ICD-10-CM

## 2021-12-27 DIAGNOSIS — E875 Hyperkalemia: Secondary | ICD-10-CM | POA: Diagnosis not present

## 2021-12-27 DIAGNOSIS — Z9889 Other specified postprocedural states: Secondary | ICD-10-CM

## 2021-12-27 DIAGNOSIS — I341 Nonrheumatic mitral (valve) prolapse: Secondary | ICD-10-CM | POA: Diagnosis not present

## 2021-12-27 MED ORDER — AMOXICILLIN 500 MG PO TABS: 2000.0000 mg | ORAL_TABLET | Freq: Once | ORAL | 2 refills | Status: AC

## 2021-12-27 NOTE — Patient Instructions (Signed)
Medication Instructions:   No changes    Need antibiotic for any dental procedures - Amoxicillin 2 gm  one hour prior to procedure  -  always have dental office to call for official  clearance   *If you need a refill on your cardiac medications before your next appointment, please call your pharmacy*   Lab Work: Cmp - today  If you have labs (blood work) drawn today and your tests are completely normal, you will receive your results only by: MyChart Message (if you have MyChart) OR A paper copy in the mail If you have any lab test that is abnormal or we need to change your treatment, we will call you to review the results.   Testing/Procedures:  Will be schedule in July 2023 at  [facility] [address] Your physician has requested that you have an echocardiogram. Echocardiography is a painless test that uses sound waves to create images of your heart. It provides your doctor with information about the size and shape of your heart and how well your hearts chambers and valves are working. This procedure takes approximately one hour. There are no restrictions for this procedure.    Follow-Up: At [facility], you and your health needs are our priority.  As part of our continuing mission to provide you with exceptional heart care, we have created designated Provider Care Teams.  These Care Teams include your primary Cardiologist (physician) and Advanced Practice Providers (APPs -  Physician Assistants and Nurse Practitioners) who all work together to provide you with the care you need, when you need it.  We recommend signing up for the patient portal called "MyChart".  Sign up information is provided on this After Visit Summary.  MyChart is used to connect with patients for Virtual Visits (Telemedicine).  Patients are able to view lab/test results, encounter notes, upcoming appointments, etc.  Non-urgent messages can be sent to your provider as well.   To learn more about what  you can do with MyChart, go to [email or web address].    Your next appointment:   6 month(s)  July after echo is completed   The format for your next appointment:   In Person  Provider:   Bryan Lemma, MD     Other Instructions  Okay to have dental procedure - need antibiotic prior to procedure
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
## 2021-12-28 LAB — COMPREHENSIVE METABOLIC PANEL
ALT: 22 IU/L (ref 0–44)
AST: 24 IU/L (ref 0–40)
Albumin/Globulin Ratio: 1.9 (ref 1.2–2.2)
Albumin: 4.7 g/dL (ref 3.8–4.8)
Alkaline Phosphatase: 86 IU/L (ref 44–121)
BUN/Creatinine Ratio: 22 (ref 10–24)
BUN: 17 mg/dL (ref 8–27)
Bilirubin Total: 1.3 mg/dL — ABNORMAL HIGH (ref 0.0–1.2)
CO2: 25 mmol/L (ref 20–29)
Calcium: 9.5 mg/dL (ref 8.6–10.2)
Chloride: 106 mmol/L (ref 96–106)
Creatinine, Ser: 0.77 mg/dL (ref 0.76–1.27)
Globulin, Total: 2.5 g/dL (ref 1.5–4.5)
Glucose: 105 mg/dL — ABNORMAL HIGH (ref 70–99)
Potassium: 4.8 mmol/L (ref 3.5–5.2)
Sodium: 147 mmol/L — ABNORMAL HIGH (ref 134–144)
Total Protein: 7.2 g/dL (ref 6.0–8.5)
eGFR: 101 mL/min/{1.73_m2} (ref >59)

## 2021-12-29 ENCOUNTER — Encounter: Payer: Self-pay | Admitting: Cardiology

## 2021-12-29 DIAGNOSIS — E875 Hyperkalemia: Secondary | ICD-10-CM | POA: Insufficient documentation

## 2021-12-29 NOTE — Assessment & Plan Note (Addendum)
Status post MVR.  Now no murmur heard on exam. No longer requires afterload reduction, as such, we can stop standing dose of losartan and only he has PRN for BP> 140/80 mmHg.  Follow-up postop echo in 6 months.

## 2021-12-29 NOTE — Assessment & Plan Note (Signed)
Status post repair

## 2021-12-29 NOTE — Assessment & Plan Note (Signed)
DES with ascending aorta 38 mm.  This is indicated to be relatively safe studies.  Can reassess probably next year at this time.

## 2021-12-29 NOTE — Assessment & Plan Note (Signed)
Intra-Op TEE showed no evidence of residual MR following surgery.  I do not hear any murmur on exam.  Several questions about his suture sites etc. along with the question about MRI compatibility of the ring; however it does look like they are compatible based on brief Internet search.  Discussed SBE prophylaxis.  We will provide amoxicillin prescription for dental procedures, GI procedures.

## 2021-12-29 NOTE — Assessment & Plan Note (Signed)
Most recent labs from June show potassium 5.2.  He was taking potassium supplementation while on Lasix, no longer on Lasix, also no longer on losartan.  Needed to at least reevaluate to ensure that the potassium is not going up.  We will check a BMP today.

## 2021-12-29 NOTE — Assessment & Plan Note (Signed)
His LDL is 137 as of May 2022, I chose not to discuss this or address the concerns of hyperlipidemia because of the prolonged discussion about other concerns.

## 2022-01-03 ENCOUNTER — Encounter: Payer: Self-pay | Admitting: *Deleted

## 2022-01-08 ENCOUNTER — Ambulatory Visit: Payer: BC Managed Care – PPO | Admitting: Cardiology

## 2022-01-09 NOTE — Addendum Note (Signed)
Addended by: Brunetta Genera on: 01/09/2022 11:15 AM   Modules accepted: Orders

## 2022-01-19 ENCOUNTER — Telehealth (HOSPITAL_COMMUNITY): Payer: Self-pay

## 2022-01-19 ENCOUNTER — Encounter (HOSPITAL_COMMUNITY): Payer: Self-pay

## 2022-01-19 NOTE — Telephone Encounter (Signed)
Attempted to call patient in regards to Cardiac Rehab - LM on VM Mailed letter 

## 2022-01-22 ENCOUNTER — Telehealth (HOSPITAL_COMMUNITY): Payer: Self-pay

## 2022-01-22 NOTE — Telephone Encounter (Signed)
Pt wife called and stated that pt doesn't need to do cardiac rehab per his cardiologist. Referral was closed

## 2022-02-12 ENCOUNTER — Ambulatory Visit: Payer: BC Managed Care – PPO | Admitting: Cardiology

## 2022-02-21 ENCOUNTER — Telehealth: Payer: Self-pay | Admitting: Cardiology

## 2022-02-21 DIAGNOSIS — Z952 Presence of prosthetic heart valve: Secondary | ICD-10-CM

## 2022-02-21 NOTE — Telephone Encounter (Signed)
Patient would like for the medication he is no longer taking to be removed from his med list.  He stated he is only taking the baby aspirin.   ?

## 2022-02-21 NOTE — Telephone Encounter (Signed)
Called to speak with patient about his medications. The phone had not voicemail and kept ringing. ?

## 2022-02-21 NOTE — Telephone Encounter (Signed)
? ?  Pre-operative Risk Assessment  ?  ?Patient Name: Randy Cruz  ?DOB: [birth year] ?MRN: [id number]  ? ?  ? ?Request for Surgical Clearance   ? ?Procedure:   teeth cleaning ? ?Date of Surgery:  Clearance 03/19/22                              ?   ?Surgeon:  Dr. Bronson Curb ?Surgeon's Group or Practice Name:  Dr. Bronson Curb office ?Phone number:  [phone] ?Fax number:  [phone] ?  ?Type of Clearance Requested:   ?- Medical  ?  ?Type of Anesthesia:  None  ?  ?Additional requests/questions:     ? ?Signed, ?Milbert Coulter   ?02/21/2022, 9:11 AM  ? ?

## 2022-02-22 MED ORDER — AMOXICILLIN 500 MG PO TABS: 2000.0000 mg | ORAL_TABLET | Freq: Once | ORAL | 0 refills | Status: AC

## 2022-02-22 NOTE — Telephone Encounter (Signed)
It is probably okay if he stops the other medicines, but should not be on the baby aspirin because of the new valve. ? ?Bryan Lemma, MD ? ?

## 2022-02-22 NOTE — Telephone Encounter (Signed)
? ?  Primary Cardiologist: Bryan Lemma, MD ? ?Chart reviewed as part of pre-operative protocol coverage. Simple dental extractions are considered low risk procedures per guidelines and generally do not require any specific cardiac clearance. It is also generally accepted that for simple extractions and dental cleanings, there is no need to interrupt blood thinner therapy.  ? ?SBE prophylaxis is required for the patient. ? ?I will route this recommendation to the requesting party via Epic fax function and remove from pre-op pool. ? ?Please call with questions. ? ?Ronney Asters, NP ?02/22/2022, 9:41 AM ? ? ? ? ?

## 2022-02-22 NOTE — Telephone Encounter (Signed)
Spoke to patient wife . She states patient is not taking any medication at present time. ? He stopped taking all medication a month after seeing Dr Herbie Baltimore including Aspirin 81 mg. ? Per wife ,blood pressure is normal so patient stopped  taking the Losartan. ? Patient and wife wife requested  to remove all medication from his medication list. ? ? Asprin 81 mg daily ?Multi vitamin  daily ?Losartan 12.5 mg daily  ?Pantoprazole 40 mg daily  ? ? All 4 medication removed as requested. ?

## 2022-03-09 ENCOUNTER — Other Ambulatory Visit: Payer: Self-pay | Admitting: Sports Medicine

## 2022-03-09 DIAGNOSIS — M25551 Pain in right hip: Secondary | ICD-10-CM

## 2022-03-09 DIAGNOSIS — M1611 Unilateral primary osteoarthritis, right hip: Secondary | ICD-10-CM

## 2022-03-09 DIAGNOSIS — M25851 Other specified joint disorders, right hip: Secondary | ICD-10-CM

## 2022-03-24 ENCOUNTER — Ambulatory Visit
Admission: RE | Admit: 2022-03-24 | Discharge: 2022-03-24 | Disposition: A | Discharge status: Home / Self Care | Payer: BC Managed Care – PPO | Source: Ambulatory Visit | Attending: Sports Medicine | Admitting: Sports Medicine

## 2022-03-24 DIAGNOSIS — M1611 Unilateral primary osteoarthritis, right hip: Secondary | ICD-10-CM

## 2022-03-24 DIAGNOSIS — M25551 Pain in right hip: Secondary | ICD-10-CM

## 2022-03-24 DIAGNOSIS — M25851 Other specified joint disorders, right hip: Secondary | ICD-10-CM

## 2022-04-09 ENCOUNTER — Other Ambulatory Visit: Payer: Self-pay

## 2022-04-09 ENCOUNTER — Encounter: Payer: Self-pay | Admitting: Infectious Disease

## 2022-04-09 ENCOUNTER — Ambulatory Visit: Payer: BC Managed Care – PPO | Admitting: Infectious Disease

## 2022-04-09 ENCOUNTER — Encounter: Payer: Self-pay | Admitting: Physical Therapy

## 2022-04-09 ENCOUNTER — Ambulatory Visit: Payer: BC Managed Care – PPO | Attending: Sports Medicine | Admitting: Physical Therapy

## 2022-04-09 VITALS — BP 158/95 | HR 78 | Temp 98.2°F | Wt 152.0 lb

## 2022-04-09 DIAGNOSIS — L03019 Cellulitis of unspecified finger: Secondary | ICD-10-CM | POA: Insufficient documentation

## 2022-04-09 DIAGNOSIS — M5459 Other low back pain: Secondary | ICD-10-CM | POA: Diagnosis present

## 2022-04-09 DIAGNOSIS — I341 Nonrheumatic mitral (valve) prolapse: Secondary | ICD-10-CM

## 2022-04-09 DIAGNOSIS — L601 Onycholysis: Secondary | ICD-10-CM | POA: Diagnosis not present

## 2022-04-09 DIAGNOSIS — M25562 Pain in left knee: Secondary | ICD-10-CM | POA: Insufficient documentation

## 2022-04-09 DIAGNOSIS — M533 Sacrococcygeal disorders, not elsewhere classified: Secondary | ICD-10-CM | POA: Insufficient documentation

## 2022-04-09 DIAGNOSIS — G8929 Other chronic pain: Secondary | ICD-10-CM | POA: Diagnosis present

## 2022-04-09 DIAGNOSIS — R2689 Other abnormalities of gait and mobility: Secondary | ICD-10-CM | POA: Diagnosis present

## 2022-04-09 DIAGNOSIS — B351 Tinea unguium: Secondary | ICD-10-CM

## 2022-04-09 DIAGNOSIS — M25561 Pain in right knee: Secondary | ICD-10-CM | POA: Insufficient documentation

## 2022-04-09 DIAGNOSIS — M4125 Other idiopathic scoliosis, thoracolumbar region: Secondary | ICD-10-CM | POA: Diagnosis present

## 2022-04-09 DIAGNOSIS — I340 Nonrheumatic mitral (valve) insufficiency: Secondary | ICD-10-CM | POA: Diagnosis not present

## 2022-04-09 DIAGNOSIS — M2170 Unequal limb length (acquired), unspecified site: Secondary | ICD-10-CM | POA: Diagnosis present

## 2022-04-09 DIAGNOSIS — Z9889 Other specified postprocedural states: Secondary | ICD-10-CM

## 2022-04-09 DIAGNOSIS — B999 Unspecified infectious disease: Secondary | ICD-10-CM | POA: Diagnosis not present

## 2022-04-09 HISTORY — DX: Cellulitis of unspecified finger: L03.019

## 2022-04-09 HISTORY — DX: Tinea unguium: B35.1

## 2022-04-09 NOTE — Patient Instructions (Addendum)
?  Avoid straining pelvic floor, abdominal muscles , spine  ?Use log rolling technique instead of getting out of bed with your neck or the sit-up  ? ? ? ?Log rolling into and out of bed ? ? ?Log rolling into and out of bed ?If getting out of bed on R side, ?Bent knees, scoot hips/ shoulder to L  ?Raise R arm completely overhead, rolling onto armpit  ?Then lower bent knees to bed to get into complete side lying position  ?Then drop legs off bed, and push up onto R elbow/forearm, and use L hand to push onto the bed ? ?___ ? ? ?Lengthen Back rib by R  shoulder  ?  ?Lie on L  side , pillow between knees and under head  ?Pull  arm overhead over mattress, grab the edge of mattress,pull it upward, drawing elbow away from ears  ?Breathing ?10 reps ? ?Switch side ? ?Open book (handout)  ?Lying on  _ side , rotating  __ only this week  ?Rotating onto pillow /yoga block  ?Pillow/ Block between knees  ?10 reps ? ?Other side  ? ?___ ? ?Wear shoe lift in shoes on left  ? ?
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
## 2022-04-12 LAB — CBC WITH DIFFERENTIAL/PLATELET
Absolute Monocytes: 317 cells/uL (ref 200–950)
Basophils Absolute: 28 cells/uL (ref 0–200)
Basophils Relative: 0.6 %
Eosinophils Absolute: 60 cells/uL (ref 15–500)
Eosinophils Relative: 1.3 %
HCT: 45.1 % (ref 38.5–50.0)
Hemoglobin: 15.2 g/dL (ref 13.2–17.1)
Lymphs Abs: 1674 cells/uL (ref 850–3900)
MCH: 31.1 pg (ref 27.0–33.0)
MCHC: 33.7 g/dL (ref 32.0–36.0)
MCV: 92.2 fL (ref 80.0–100.0)
MPV: 11.1 fL (ref 7.5–12.5)
Monocytes Relative: 6.9 %
Neutro Abs: 2521 cells/uL (ref 1500–7800)
Neutrophils Relative %: 54.8 %
Platelets: 143 10*3/uL (ref 140–400)
RBC: 4.89 10*6/uL (ref 4.20–5.80)
RDW: 13.1 % (ref 11.0–15.0)
Total Lymphocyte: 36.4 %
WBC: 4.6 10*3/uL (ref 3.8–10.8)

## 2022-04-12 LAB — HIV ANTIBODY (ROUTINE TESTING W REFLEX): HIV 1&2 Ab, 4th Generation: NONREACTIVE

## 2022-04-12 LAB — IGG, IGA, IGM
IgG (Immunoglobin G), Serum: 1041 mg/dL (ref 600–1540)
IgM, Serum: 225 mg/dL (ref 50–300)
Immunoglobulin A: 225 mg/dL (ref 70–320)

## 2022-04-12 LAB — COMPLEMENT, TOTAL: Compl, Total (CH50): 20 U/mL — ABNORMAL LOW (ref 31–60)

## 2022-04-13 LAB — NEUTROPHIL OXD BURST
% OXIDATION POSITIVE NEUTROPHILS: 97 %
SPECIMEN AGE: 24

## 2022-04-16 ENCOUNTER — Ambulatory Visit: Payer: BC Managed Care – PPO | Admitting: Physical Therapy

## 2022-04-16 DIAGNOSIS — M2170 Unequal limb length (acquired), unspecified site: Secondary | ICD-10-CM

## 2022-04-16 DIAGNOSIS — M4125 Other idiopathic scoliosis, thoracolumbar region: Secondary | ICD-10-CM

## 2022-04-16 DIAGNOSIS — R2689 Other abnormalities of gait and mobility: Secondary | ICD-10-CM

## 2022-04-16 DIAGNOSIS — M533 Sacrococcygeal disorders, not elsewhere classified: Secondary | ICD-10-CM

## 2022-04-16 DIAGNOSIS — M5459 Other low back pain: Secondary | ICD-10-CM

## 2022-04-16 DIAGNOSIS — G8929 Other chronic pain: Secondary | ICD-10-CM
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
## 2022-04-16 NOTE — Patient Instructions (Addendum)
Higher knees with walking,  ? ?Feel width of feet hit the ground , ballmounds and heels equal , notice where is center of mass  ? ?This will give your longer stride length ? ?Rotation at trunk, arm swings ? ?Practice this type of walking along running tracks and trails free of people to keep your own speed, do not practice in community crowded setting,  ?Still notice picking up knees when having to slow speed around people  ?___ ? ? ?Feet slides : ?  ?Points of contact at sitting bones  ?Four points of contact of foot,  ?Side knee back while keeping knee out along 2-3rd toe line  ? ?Heel up, ankle not twist out ?Lower heel while keeping knee out along 2-3rd toe line ?Four points of contact of foot, ?Slide foot back while keeping knee out along 2-3rd toe line ?  ?Repeated with other foot  ? ___ ? ?Keep shoe lift levelled  not higher in heel ? ?

## 2022-04-17 ENCOUNTER — Encounter: Payer: BC Managed Care – PPO | Admitting: Physical Therapy

## 2022-04-17 ENCOUNTER — Other Ambulatory Visit: Payer: Self-pay

## 2022-04-17 ENCOUNTER — Ambulatory Visit: Payer: BC Managed Care – PPO | Admitting: Infectious Disease

## 2022-04-17 ENCOUNTER — Encounter: Payer: Self-pay | Admitting: Infectious Disease

## 2022-04-17 VITALS — BP 154/101 | HR 102 | Temp 97.9°F | Wt 151.4 lb

## 2022-04-17 DIAGNOSIS — Z23 Encounter for immunization: Secondary | ICD-10-CM | POA: Diagnosis not present

## 2022-04-17 DIAGNOSIS — L03019 Cellulitis of unspecified finger: Secondary | ICD-10-CM

## 2022-04-17 DIAGNOSIS — B351 Tinea unguium: Secondary | ICD-10-CM | POA: Diagnosis not present

## 2022-04-17 DIAGNOSIS — I340 Nonrheumatic mitral (valve) insufficiency: Secondary | ICD-10-CM | POA: Diagnosis not present

## 2022-04-17 DIAGNOSIS — Z7185 Encounter for immunization safety counseling: Secondary | ICD-10-CM

## 2022-04-17 DIAGNOSIS — R7989 Other specified abnormal findings of blood chemistry: Secondary | ICD-10-CM | POA: Diagnosis not present
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
## 2022-04-23 ENCOUNTER — Ambulatory Visit: Payer: BC Managed Care – PPO | Admitting: Physical Therapy

## 2022-04-23 DIAGNOSIS — M533 Sacrococcygeal disorders, not elsewhere classified: Secondary | ICD-10-CM

## 2022-04-23 DIAGNOSIS — R2689 Other abnormalities of gait and mobility: Secondary | ICD-10-CM

## 2022-04-23 DIAGNOSIS — M4125 Other idiopathic scoliosis, thoracolumbar region: Secondary | ICD-10-CM

## 2022-04-23 DIAGNOSIS — M2170 Unequal limb length (acquired), unspecified site: Secondary | ICD-10-CM | POA: Diagnosis not present

## 2022-04-23 DIAGNOSIS — M5459 Other low back pain: Secondary | ICD-10-CM
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
## 2022-05-01 LAB — ANA: Anti Nuclear Antibody (ANA): NEGATIVE

## 2022-05-01 LAB — COMPLEMENT COMPONENT C1Q: Complement C1Q: 6.7 mg/dL (ref 5.0–8.6)

## 2022-05-01 LAB — COMPLEMENT, TOTAL: Compl, Total (CH50): 24 U/mL — ABNORMAL LOW (ref 31–60)

## 2022-05-01 LAB — MAYO MISC ORDER 2: PRICE:: 97.6

## 2022-05-01 LAB — C3 AND C4
C3 Complement: 122 mg/dL (ref 82–185)
C4 Complement: 19 mg/dL (ref 15–53)

## 2022-05-03 ENCOUNTER — Ambulatory Visit: Payer: BC Managed Care – PPO | Attending: Sports Medicine | Admitting: Physical Therapy

## 2022-05-03 DIAGNOSIS — M2170 Unequal limb length (acquired), unspecified site: Secondary | ICD-10-CM | POA: Diagnosis present

## 2022-05-03 DIAGNOSIS — M533 Sacrococcygeal disorders, not elsewhere classified: Secondary | ICD-10-CM | POA: Insufficient documentation

## 2022-05-03 DIAGNOSIS — G8929 Other chronic pain: Secondary | ICD-10-CM | POA: Insufficient documentation

## 2022-05-03 DIAGNOSIS — R2689 Other abnormalities of gait and mobility: Secondary | ICD-10-CM | POA: Diagnosis present

## 2022-05-03 DIAGNOSIS — M4125 Other idiopathic scoliosis, thoracolumbar region: Secondary | ICD-10-CM | POA: Insufficient documentation

## 2022-05-03 DIAGNOSIS — M25561 Pain in right knee: Secondary | ICD-10-CM | POA: Diagnosis present

## 2022-05-03 DIAGNOSIS — M5459 Other low back pain: Secondary | ICD-10-CM | POA: Diagnosis present

## 2022-05-03 DIAGNOSIS — M25562 Pain in left knee: Secondary | ICD-10-CM | POA: Diagnosis present

## 2022-05-03 NOTE — Patient Instructions (Signed)
Ankle strengthening on R with band band wrapped around outer R side of foot ballmound of R foot pressing onto band , L foot is placed on top of band hip width apart, with the ballmound ,  R hand holds the band 30 reps swinging R pinky toe out to the R  X 2x day  __  Stretches for inner thigh muscles attached near to the testicle  Figure 4 sitting and modify it on the L due to limited mobility, by turning body at 45 deg on the bench to rest the L thigh/ knee on pillow   5 breaths each, 3 x day
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
## 2022-05-09 ENCOUNTER — Encounter: Payer: BC Managed Care – PPO | Admitting: Physical Therapy

## 2022-05-10 ENCOUNTER — Ambulatory Visit: Payer: BC Managed Care – PPO | Admitting: Physical Therapy

## 2022-05-10 DIAGNOSIS — R2689 Other abnormalities of gait and mobility: Secondary | ICD-10-CM | POA: Diagnosis not present

## 2022-05-10 DIAGNOSIS — G8929 Other chronic pain: Secondary | ICD-10-CM

## 2022-05-10 DIAGNOSIS — M533 Sacrococcygeal disorders, not elsewhere classified: Secondary | ICD-10-CM

## 2022-05-10 DIAGNOSIS — M5459 Other low back pain: Secondary | ICD-10-CM

## 2022-05-10 DIAGNOSIS — M4125 Other idiopathic scoliosis, thoracolumbar region: Secondary | ICD-10-CM

## 2022-05-10 DIAGNOSIS — M2170 Unequal limb length (acquired), unspecified site: Secondary | ICD-10-CM

## 2022-05-10 DIAGNOSIS — M25562 Pain in left knee: Secondary | ICD-10-CM
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
## 2022-05-22 ENCOUNTER — Encounter: Payer: BC Managed Care – PPO | Admitting: Physical Therapy

## 2022-05-23 ENCOUNTER — Ambulatory Visit: Payer: BC Managed Care – PPO | Admitting: Physical Therapy

## 2022-05-23 DIAGNOSIS — M4125 Other idiopathic scoliosis, thoracolumbar region: Secondary | ICD-10-CM

## 2022-05-23 DIAGNOSIS — R2689 Other abnormalities of gait and mobility: Secondary | ICD-10-CM | POA: Diagnosis not present

## 2022-05-23 DIAGNOSIS — M533 Sacrococcygeal disorders, not elsewhere classified: Secondary | ICD-10-CM

## 2022-05-23 DIAGNOSIS — M2170 Unequal limb length (acquired), unspecified site: Secondary | ICD-10-CM

## 2022-05-23 DIAGNOSIS — M5459 Other low back pain: Secondary | ICD-10-CM
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
## 2022-05-23 NOTE — Patient Instructions (Addendum)
   Open book (handout)  Lying on   L _ side , rotating  _R _ only this week 3/4 turn  Rotating onto pillow /yoga block  Pillow between knees and behind your back  10 reps    Multifidis twist  green band  Band is on doorknob: sit in chair halfway to the front from door (facing perpendicular)   Twisting trunk without moving the hips and knees Hold band at the level of ribcage, elbows bent,shoulder blades roll back and down like squeezing a pencil under armpit   Exhale twist,.10-15 deg away from door without moving your hips/ knees, press more weight on the side of the sitting bones/ foot opp of your direction of turn as your counterweight. Continue to maintain equal weight through legs.  Keep knee unlocked.  30 reps   ___  Document the good days and activities you are now able to do with less pain   HOld off on the breathing deep core exercises this week because you had dizziness issues with them despite trying them seated

## 2022-05-28 ENCOUNTER — Encounter: Payer: Self-pay | Admitting: Physical Therapy

## 2022-05-28 ENCOUNTER — Ambulatory Visit: Payer: BC Managed Care – PPO | Admitting: Physical Therapy

## 2022-05-28 DIAGNOSIS — R2689 Other abnormalities of gait and mobility: Secondary | ICD-10-CM

## 2022-05-28 DIAGNOSIS — M533 Sacrococcygeal disorders, not elsewhere classified: Secondary | ICD-10-CM

## 2022-05-28 DIAGNOSIS — M2170 Unequal limb length (acquired), unspecified site: Secondary | ICD-10-CM

## 2022-05-28 DIAGNOSIS — G8929 Other chronic pain: Secondary | ICD-10-CM

## 2022-05-28 DIAGNOSIS — M4125 Other idiopathic scoliosis, thoracolumbar region: Secondary | ICD-10-CM

## 2022-05-28 DIAGNOSIS — M5459 Other low back pain: Secondary | ICD-10-CM

## 2022-06-06 ENCOUNTER — Encounter: Payer: Self-pay | Admitting: Physical Therapy

## 2022-06-06 ENCOUNTER — Ambulatory Visit: Payer: BC Managed Care – PPO | Attending: Sports Medicine | Admitting: Physical Therapy

## 2022-06-06 DIAGNOSIS — R2689 Other abnormalities of gait and mobility: Secondary | ICD-10-CM | POA: Insufficient documentation

## 2022-06-06 DIAGNOSIS — M2170 Unequal limb length (acquired), unspecified site: Secondary | ICD-10-CM | POA: Insufficient documentation

## 2022-06-06 DIAGNOSIS — M533 Sacrococcygeal disorders, not elsewhere classified: Secondary | ICD-10-CM | POA: Diagnosis not present

## 2022-06-06 DIAGNOSIS — M25562 Pain in left knee: Secondary | ICD-10-CM | POA: Insufficient documentation

## 2022-06-06 DIAGNOSIS — M5459 Other low back pain: Secondary | ICD-10-CM | POA: Diagnosis present

## 2022-06-06 DIAGNOSIS — M4125 Other idiopathic scoliosis, thoracolumbar region: Secondary | ICD-10-CM | POA: Diagnosis present

## 2022-06-06 DIAGNOSIS — G8929 Other chronic pain: Secondary | ICD-10-CM | POA: Diagnosis present

## 2022-06-06 DIAGNOSIS — M25561 Pain in right knee: Secondary | ICD-10-CM | POA: Insufficient documentation

## 2022-06-06 NOTE — Patient Instructions (Addendum)
Stretch for pelvic floor      On belly: Riding horse edge of mattress  knee bent like riding a horse, move knee towards armpit and out  10 reps   Mermaid stretch  Rocking while seated on the floor with heels to one side of the hip Heels to one side of the hip  Rock forward towards the knee that is bent , rock beck towards the opposite sitting bones   __  Standing one on leg 30 sec without holding anything 3 x day , stacked posture, no leaning   ___  Hand on table, heel raises on one foot 25 reps each leg 3 x day
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
## 2022-06-13 ENCOUNTER — Ambulatory Visit: Payer: BC Managed Care – PPO | Admitting: Physical Therapy

## 2022-06-14 ENCOUNTER — Encounter: Payer: Self-pay | Admitting: Urology

## 2022-06-14 ENCOUNTER — Ambulatory Visit: Payer: BC Managed Care – PPO | Admitting: Urology

## 2022-06-14 VITALS — BP 157/94 | HR 88 | Ht 69.0 in | Wt 151.0 lb

## 2022-06-14 DIAGNOSIS — N50811 Right testicular pain: Secondary | ICD-10-CM

## 2022-06-14 DIAGNOSIS — G8929 Other chronic pain: Secondary | ICD-10-CM | POA: Diagnosis not present

## 2022-06-14 DIAGNOSIS — R102 Pelvic and perineal pain: Secondary | ICD-10-CM

## 2022-06-14 MED ORDER — MELOXICAM 7.5 MG PO TABS: 7.5000 mg | ORAL_TABLET | Freq: Every day | ORAL | 2 refills | Status: DC
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
## 2022-06-20 ENCOUNTER — Encounter: Payer: BC Managed Care – PPO | Admitting: Physical Therapy

## 2022-06-21 ENCOUNTER — Ambulatory Visit (HOSPITAL_COMMUNITY): Payer: BC Managed Care – PPO | Attending: Internal Medicine

## 2022-06-21 DIAGNOSIS — I340 Nonrheumatic mitral (valve) insufficiency: Secondary | ICD-10-CM | POA: Diagnosis not present

## 2022-06-21 DIAGNOSIS — Z9889 Other specified postprocedural states: Secondary | ICD-10-CM | POA: Diagnosis not present

## 2022-06-21 DIAGNOSIS — I341 Nonrheumatic mitral (valve) prolapse: Secondary | ICD-10-CM | POA: Insufficient documentation

## 2022-06-21 HISTORY — PX: TRANSTHORACIC ECHOCARDIOGRAM: SHX275

## 2022-06-21 LAB — ECHOCARDIOGRAM COMPLETE
Area-P 1/2: 3.97 cm2
MV VTI: 2.29 cm2
P 1/2 time: 335 msec
S' Lateral: 3.5 cm

## 2022-06-25 ENCOUNTER — Encounter: Payer: Self-pay | Admitting: Cardiology

## 2022-06-25 ENCOUNTER — Ambulatory Visit: Payer: BC Managed Care – PPO | Admitting: Cardiology

## 2022-06-25 VITALS — BP 122/78 | HR 68 | Ht 69.0 in | Wt 150.2 lb

## 2022-06-25 DIAGNOSIS — I341 Nonrheumatic mitral (valve) prolapse: Secondary | ICD-10-CM | POA: Diagnosis not present

## 2022-06-25 DIAGNOSIS — Z9889 Other specified postprocedural states: Secondary | ICD-10-CM | POA: Diagnosis not present

## 2022-06-25 DIAGNOSIS — E785 Hyperlipidemia, unspecified: Secondary | ICD-10-CM | POA: Diagnosis not present

## 2022-06-25 DIAGNOSIS — I340 Nonrheumatic mitral (valve) insufficiency: Secondary | ICD-10-CM | POA: Diagnosis not present

## 2022-06-25 DIAGNOSIS — I77810 Thoracic aortic ectasia: Secondary | ICD-10-CM | POA: Diagnosis not present

## 2022-06-25 NOTE — Patient Instructions (Signed)
Medication Instructions:  No changes *If you need a refill on your cardiac medications before your next appointment, please call your pharmacy*   Lab Work: none    Testing/Procedures: Will be schedule in Dec 2023 - late    [address] Your physician has requested that you have an echocardiogram. Echocardiography is a painless test that uses sound waves to create images of your heart. It provides your doctor with information about the size and shape of your heart and how well your heart's chambers and valves are working. This procedure takes approximately one hour. There are no restrictions for this procedure.'   Follow-Up: At [facility], you and your health needs are our priority.  As part of our continuing mission to provide you with exceptional heart care, we have created designated Provider Care Teams.  These Care Teams include your primary Cardiologist (physician) and Advanced Practice Providers (APPs -  Physician Assistants and Nurse Practitioners) who all work together to provide you with the care you need, when you need it.  We recommend signing up for the patient portal called "MyChart".  Sign up information is provided on this After Visit Summary.  MyChart is used to connect with patients for Virtual Visits (Telemedicine).  Patients are able to view lab/test results, encounter notes, upcoming appointments, etc.  Non-urgent messages can be sent to your provider as well.   To learn more about what you can do with MyChart, go to [email or web address].    Your next appointment:   6 month(s)  The format for your next appointment:   In Person  Provider:   Bryan Lemma, MD     Other Instructions   Pleas get establish with a primary care doctor-  suggest  using a primary in the [facility] system  Important Information About [employer]
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
## 2022-06-27 ENCOUNTER — Encounter: Payer: BC Managed Care – PPO | Admitting: Physical Therapy

## 2022-07-01 ENCOUNTER — Encounter: Payer: Self-pay | Admitting: Cardiology

## 2022-07-01 NOTE — Assessment & Plan Note (Signed)
Trivial aortic dilation, we can reassess by CT scan 2 years after last test which would be in late 2024

## 2022-07-01 NOTE — Assessment & Plan Note (Signed)
Has not had labs checked since 2022.  I recommend that he does follow-up with his PCP can help manage lipids and other noncardiac issues.  He will look for PCP in the [facility] health system.  He is very reluctant to take any medications and therefore we did not go forward with initiation of statin.

## 2022-07-01 NOTE — Assessment & Plan Note (Signed)
Follow-up postop echo with well-healed mitral valve repair.  Trivial posterior directed MR jet.  Stable Gore-Tex cords.  EF now decreased from 65-70% down to 55-60% which is expected with resolution of MR.  I explained the physiology behind this.  They were somewhat concerned about the drop in EF and more sure. Happy to see that the valve is working well.  Remains asymptomatic.   At this point, he is not on any medications.  We will simply follow-up in echocardiogram in late December and see him back in January.  Discussed SBE prophylaxis.

## 2022-07-01 NOTE — Assessment & Plan Note (Signed)
6 months out postop echo looks great.  Valve looks wonderful. Still not having any symptoms.  Doing well.  Never really had symptoms prior to his surgery.    We will reassess Echo in 6 months.  SBE prophylaxis reiterated.  (Deep dental cleaning, GI procedures)

## 2022-07-01 NOTE — Assessment & Plan Note (Signed)
Status post MVR. Stable on echo 6 months out.  Follow-up echo in December.  If stable at that time, would switch to annual follow-up

## 2022-07-04 ENCOUNTER — Encounter: Payer: BC Managed Care – PPO | Admitting: Physical Therapy

## 2022-07-18 ENCOUNTER — Encounter: Payer: BC Managed Care – PPO | Admitting: Physical Therapy

## 2022-07-23 ENCOUNTER — Encounter: Payer: BC Managed Care – PPO | Admitting: Physical Therapy

## 2022-08-01 ENCOUNTER — Encounter: Payer: BC Managed Care – PPO | Admitting: Physical Therapy

## 2022-11-02 HISTORY — PX: TRANSTHORACIC ECHOCARDIOGRAM: SHX275

## 2022-11-06 ENCOUNTER — Ambulatory Visit (HOSPITAL_COMMUNITY): Payer: BC Managed Care – PPO | Attending: Cardiology

## 2022-11-06 DIAGNOSIS — Z9889 Other specified postprocedural states: Secondary | ICD-10-CM | POA: Insufficient documentation

## 2022-11-06 DIAGNOSIS — I340 Nonrheumatic mitral (valve) insufficiency: Secondary | ICD-10-CM | POA: Insufficient documentation

## 2022-11-06 LAB — ECHOCARDIOGRAM COMPLETE
Area-P 1/2: 2.04 cm2
MV VTI: 1.67 cm2
S' Lateral: 3.7 cm

## 2022-11-13 ENCOUNTER — Ambulatory Visit: Payer: BC Managed Care – PPO | Admitting: General Practice

## 2022-11-14 ENCOUNTER — Other Ambulatory Visit: Payer: Self-pay | Admitting: Ophthalmology

## 2022-11-14 DIAGNOSIS — G43109 Migraine with aura, not intractable, without status migrainosus: Secondary | ICD-10-CM

## 2022-11-18 IMAGING — CT CT RENAL STONE PROTOCOL
2 of 4 series · 15 of 46 positions shown, 17 images · non-contrast
Comparison: CT Abdomen and Pelvis 04/11/2021, 07/31/2016.

CLINICAL DATA: 64-year-old male with right flank pain. Right
testicular pain since [REDACTED]. Microscopic hematuria.

EXAM:
CT ABDOMEN AND PELVIS WITHOUT CONTRAST
TECHNIQUE: Multidetector CT imaging of the abdomen and pelvis was performed
following the standard protocol without IV contrast.

[Series 2: axial st · axial · 0.68mm/px · z∈[+1020,+1400]mm · 12 of 86 slices shown, 14 images]
[im 5/86  soft-tissue]
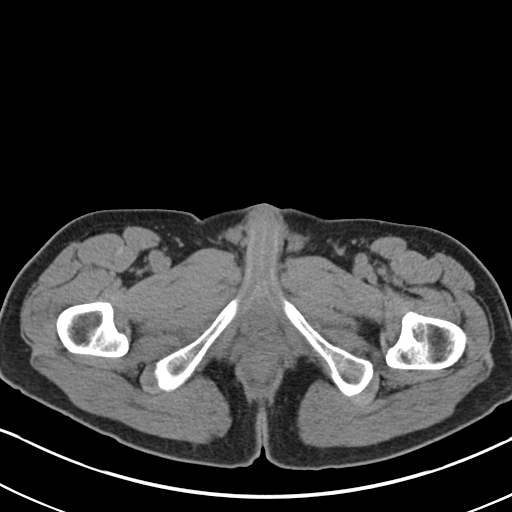
[im 5/86  bone]
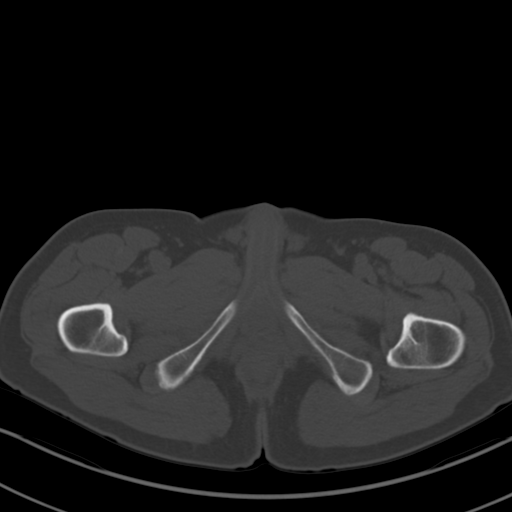
[im 14/86  soft-tissue]
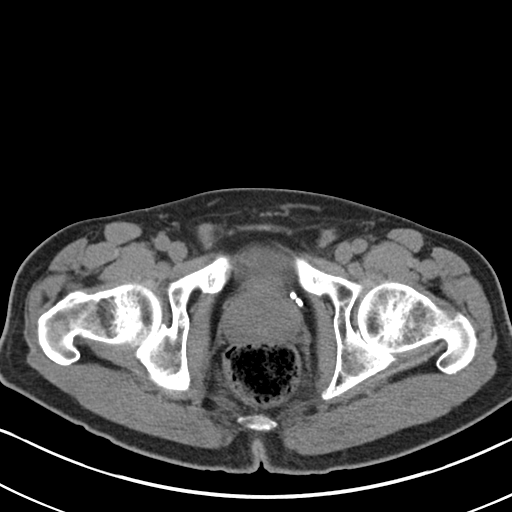
[im 18/86  soft-tissue]
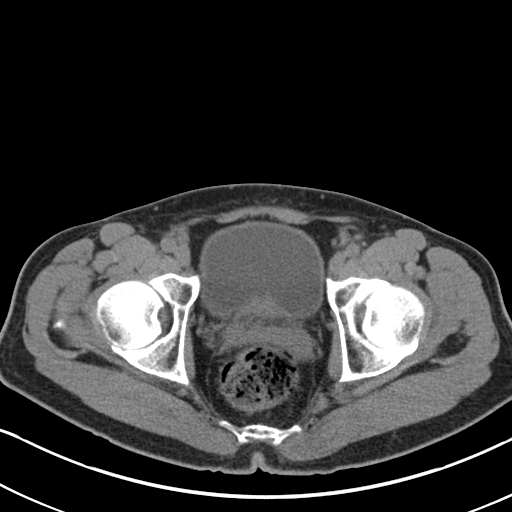
[im 27/86  soft-tissue]
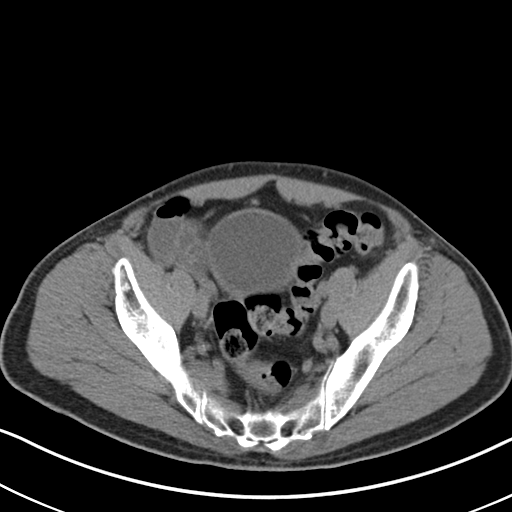
[im 32/86  soft-tissue]
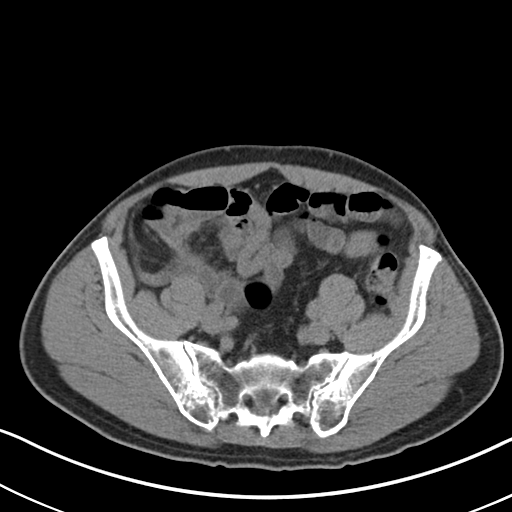
[im 41/86  soft-tissue]
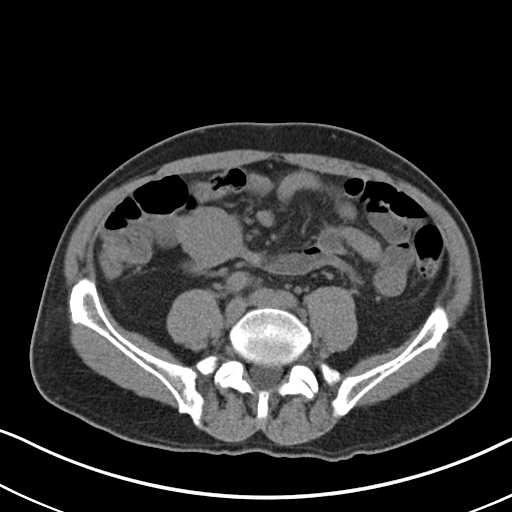
[im 45/86  soft-tissue]
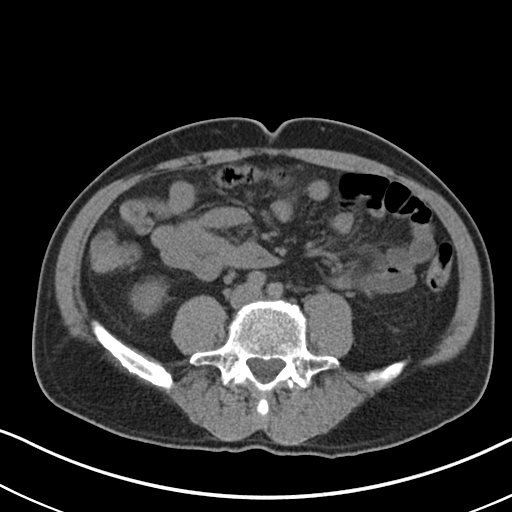
[im 54/86  soft-tissue]
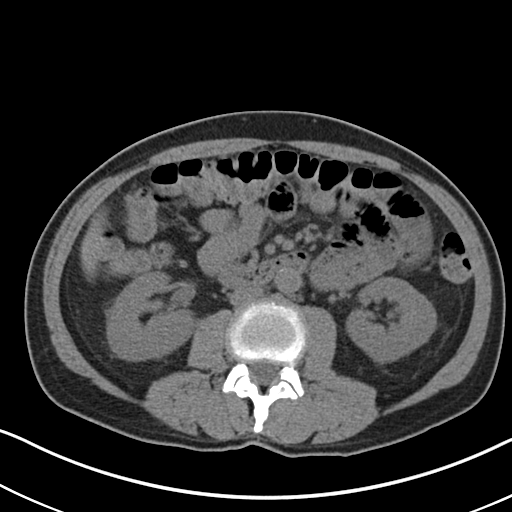
[im 59/86  soft-tissue]
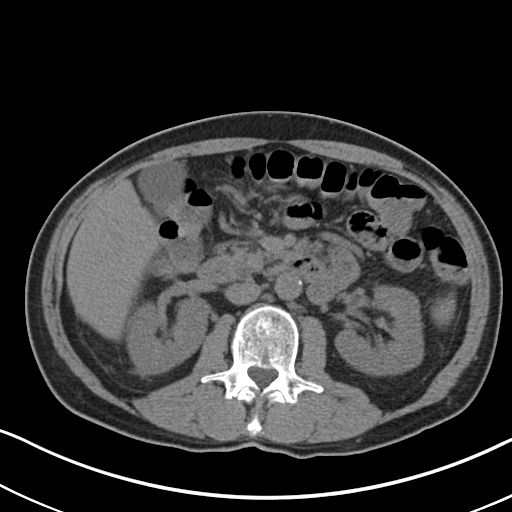
[im 59/86  bone]
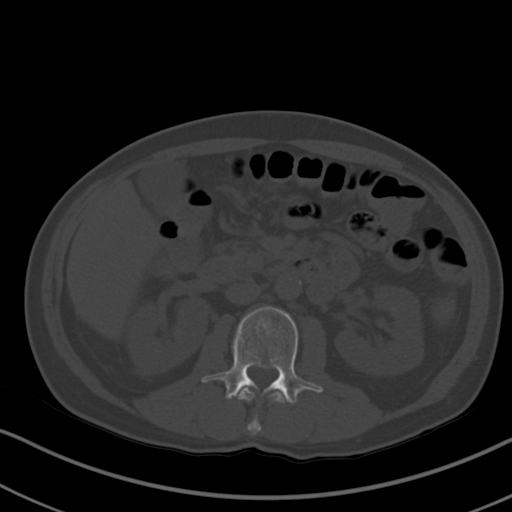
[im 68/86  soft-tissue]
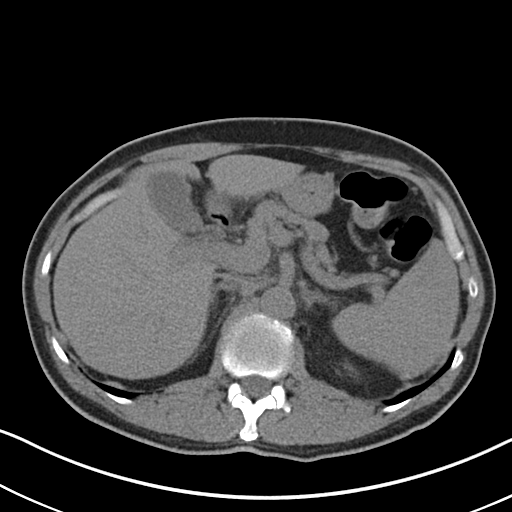
[im 72/86  soft-tissue]
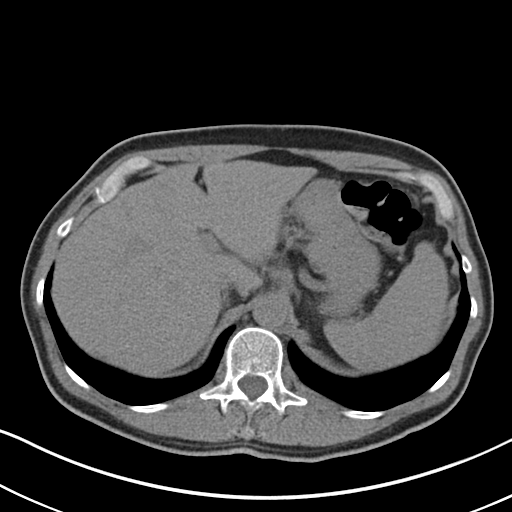
[im 81/86  soft-tissue]
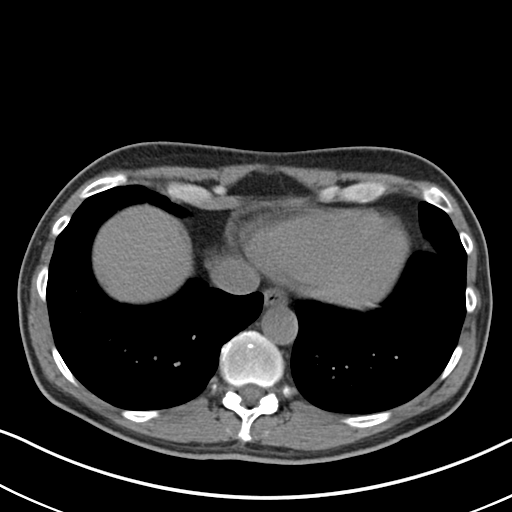

[Series 4: coronal · coronal · 0.63mm/px · 3 of 119 slices shown]
[im 40/119  soft-tissue]
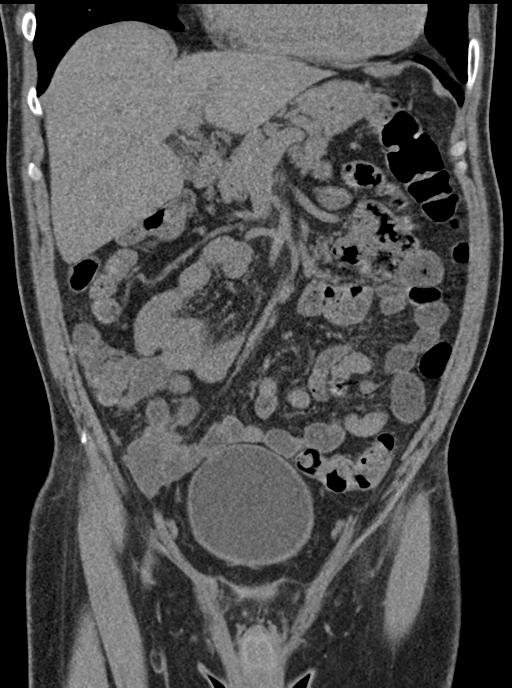
[im 53/119  soft-tissue]
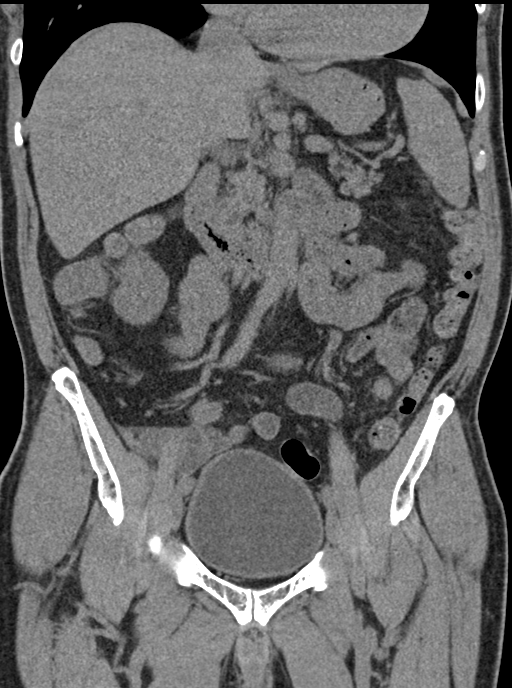
[im 66/119  soft-tissue]
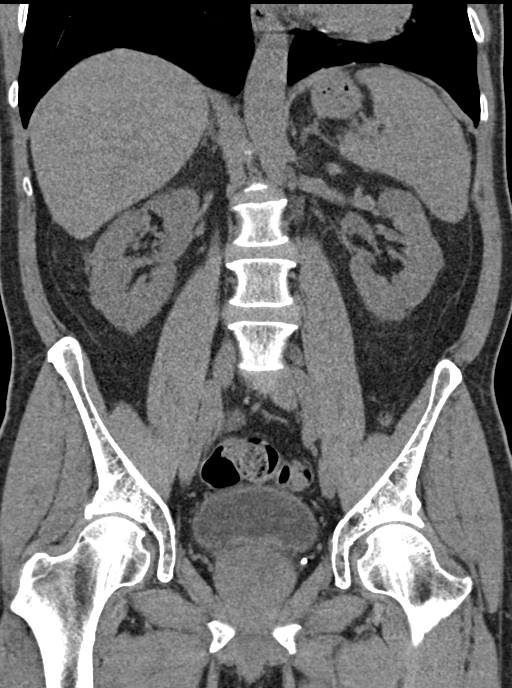

[15 of 46 positions shown; findings below may reference images not displayed]

FINDINGS: Lower chest: Pectus excavatum. Stable cardiac size at the upper
limits of normal. No pericardial or pleural effusion. Negative lung
bases.

Hepatobiliary: Negative noncontrast liver and gallbladder.

Pancreas: Negative.

Spleen: Negative.

Adrenals/Urinary Tract: Normal adrenal glands. Nonobstructed
kidneys. Punctate left nephrolithiasis. Both ureters are
decompressed. Unremarkable bladder. Chronic pelvic phleboliths.

Stomach/Bowel: Similar retained stool in the large bowel. No
discrete large bowel wall thickening, but the appendix is not well
delineated today (series 2, image 55) perhaps with trace Peri
appendiceal fluid. But no regional mesenteric inflammation.

Fluid in the nondilated terminal ileum and other nondilated small
bowel loops throughout the abdomen and pelvis. No small bowel wall
thickening or mesenteric stranding. Decompressed stomach and
duodenum. No free air. No other free fluid.

Vascular/Lymphatic: Normal caliber abdominal aorta. Faint calcified
atherosclerosis. No lymphadenopathy.

Reproductive: Negative; the entire scrotum is not included.

Other: No pelvic free fluid.

Musculoskeletal: No acute osseous abnormality identified.
IMPRESSION: 1. Indistinct appearance of the appendix today, with questionable
trace periappendiceal fluid. But no regional inflammatory stranding
to strongly suggest appendicitis.
If the clinical presentation is equivocal for appendicitis then
consider a repeat CT Abdomen and Pelvis with oral and IV contrast.

2. No other acute or inflammatory process identified in the
non-contrast abdomen or pelvis. Punctate left nephrolithiasis with
no obstructive uropathy.

## 2022-11-20 ENCOUNTER — Ambulatory Visit
Admission: RE | Admit: 2022-11-20 | Discharge: 2022-11-20 | Disposition: A | Discharge status: Home / Self Care | Payer: BC Managed Care – PPO | Source: Ambulatory Visit | Attending: Ophthalmology | Admitting: Ophthalmology

## 2022-11-20 DIAGNOSIS — G43109 Migraine with aura, not intractable, without status migrainosus: Secondary | ICD-10-CM | POA: Diagnosis present

## 2022-11-20 MED ORDER — GADOBUTROL 1 MMOL/ML IV SOLN
6.0000 mL | Freq: Once | INTRAVENOUS | Status: AC | PRN
  Administered 2022-11-20: 6 mL via INTRAVENOUS

## 2022-12-14 ENCOUNTER — Encounter: Payer: Self-pay | Admitting: Cardiology

## 2022-12-14 ENCOUNTER — Ambulatory Visit: Payer: BC Managed Care – PPO | Attending: General Practice | Admitting: Cardiology

## 2022-12-14 VITALS — BP 122/84 | HR 78 | Ht 69.0 in | Wt 155.4 lb

## 2022-12-14 DIAGNOSIS — I77810 Thoracic aortic ectasia: Secondary | ICD-10-CM | POA: Diagnosis not present

## 2022-12-14 DIAGNOSIS — I340 Nonrheumatic mitral (valve) insufficiency: Secondary | ICD-10-CM

## 2022-12-14 DIAGNOSIS — Z9889 Other specified postprocedural states: Secondary | ICD-10-CM

## 2022-12-14 NOTE — Patient Instructions (Signed)
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
## 2022-12-16 ENCOUNTER — Encounter: Payer: Self-pay | Admitting: Cardiology
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
## 2022-12-17 ENCOUNTER — Encounter: Payer: Self-pay | Admitting: Cardiology

## 2022-12-17 NOTE — Assessment & Plan Note (Signed)
Doing well.  Recovered with no major issues.  Follow-up echocardiograms have been normal.  We can wait until end of 2025 to reassess by echo.

## 2022-12-17 NOTE — Assessment & Plan Note (Signed)
Status post complicated repair with removal of excess tissue, band placement and Gore-Tex cords replacement.  Still has some mild residual prolapse but no MR.  No MS.  EF is normal--previously hide because of mitral regurgitation, indicating that his current EF is his normal EF.  Discussed SBE prophylaxis.Marland Kitchen

## 2022-12-17 NOTE — Assessment & Plan Note (Signed)
Trivial elevation.  Continue to monitor by echo actually if it gets larger we will switch to CT scan.  Should be having echoes done every couple years.

## 2023-11-13 ENCOUNTER — Other Ambulatory Visit: Payer: Self-pay | Admitting: Physical Medicine & Rehabilitation

## 2023-11-13 DIAGNOSIS — G8929 Other chronic pain: Secondary | ICD-10-CM

## 2023-11-21 ENCOUNTER — Ambulatory Visit
Admission: RE | Admit: 2023-11-21 | Discharge: 2023-11-21 | Disposition: A | Discharge status: Home / Self Care | Payer: BC Managed Care – PPO | Source: Ambulatory Visit | Attending: Physical Medicine & Rehabilitation | Admitting: Physical Medicine & Rehabilitation

## 2023-11-21 DIAGNOSIS — M5441 Lumbago with sciatica, right side: Secondary | ICD-10-CM | POA: Diagnosis present

## 2023-11-21 DIAGNOSIS — G8929 Other chronic pain: Secondary | ICD-10-CM | POA: Insufficient documentation

## 2024-01-01 ENCOUNTER — Ambulatory Visit (INDEPENDENT_AMBULATORY_CARE_PROVIDER_SITE_OTHER): Payer: Self-pay

## 2024-01-01 DIAGNOSIS — Z23 Encounter for immunization: Secondary | ICD-10-CM

## 2024-01-14 ENCOUNTER — Encounter: Payer: Self-pay | Admitting: Cardiology

## 2024-01-14 ENCOUNTER — Ambulatory Visit: Payer: BC Managed Care – PPO | Attending: Cardiology | Admitting: Cardiology

## 2024-01-14 VITALS — BP 138/82 | HR 67 | Ht 69.0 in | Wt 155.0 lb

## 2024-01-14 DIAGNOSIS — Z9889 Other specified postprocedural states: Secondary | ICD-10-CM

## 2024-01-14 DIAGNOSIS — I77810 Thoracic aortic ectasia: Secondary | ICD-10-CM | POA: Diagnosis not present

## 2024-01-14 DIAGNOSIS — R030 Elevated blood-pressure reading, without diagnosis of hypertension: Secondary | ICD-10-CM | POA: Diagnosis not present

## 2024-01-14 DIAGNOSIS — E785 Hyperlipidemia, unspecified: Secondary | ICD-10-CM | POA: Diagnosis not present

## 2024-01-14 DIAGNOSIS — I341 Nonrheumatic mitral (valve) prolapse: Secondary | ICD-10-CM | POA: Diagnosis not present

## 2024-01-14 NOTE — Progress Notes (Unsigned)
Cardiology Office Note:  .   Date:  01/16/2024  ID:  Randy Cruz, DOB February 15, 1956, MRN [id number] PCP: Dorothey Baseman, MD  [facility] Providers Cardiologist:  Bryan Lemma, MD     Chief Complaint  Patient presents with   Follow-up    12 months.   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  No symptoms.    Patient Profile: .     Randy Cruz is a  68 y.o. 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  Mitral Prolapse/Mitral Regurgitation: Initial Consult With [facility] June 2018 with Dr. Eden Emms: Establishing care for evaluation of mitral prolapse and MR.  Had been previously followed in [place]-with last assessment in 2008 Echo July 2018: Moderate MVP of anterior posterior leaflet.  Mild MR.  Moderate LA dilation.  (Lost to follow-up) Referred for "Dilated Thoracic Aorta "September 2022: CT scan suggested Thoracic Ascending Aorta measured at 41 mm.. => Follow-up echo ordered to assess significant murmur with known MVP Echo September 2022: EF 65 to 70%.  Mild elevated PAP.  Severe MR with severe prolapse/possible flare of posterior leaflet causing eccentric anterior MR jet.  TEE recommended. TEE 10/04/2021:Severe LA dilation.  This severe flail motion of P2 scallop Post MV leaflet due to ruptured chordae, highly eccentric jet of severe MR directed anteriorly and towards septum.  Also ruptured chordae of the lateral portion of A2.  Severe prolapse of medial third (A3), of anterior leaflet with moderate to severe eccentric MR hugging posterior wall of LA.  Overall regurgitant area/volume well over 100 mL. => Severe MR with severe prolapse of both leaflets.  Also noted myxomatous tricuspid valve. Normal coronaries on cath Referred to [facility] for MVR Mitral Valvuloplasty (11/16/2021): Radical reconstruction of mitral valve with 38 mm ring; Gore-Tex  artificial cords to P2 and A3. Postop A-fib-on amiodarone; followed by atrial flutter converted with DCCV    Hani Campusano was last seen on December 14, 2022 for routine follow-up (2 years out from mitral valve surgery).  As usual, his major complaints are noncardiac in nature.  No CHF symptoms of PND, orthopnea edema.  No arrhythmia symptoms to suggest recurrence of his postop A-fib.  No angina.  Staying active.  No med changes.  Recommended SBE prophylaxis for invasive dental procedures or GI procedures.  Subjective  Discussed the use of AI scribe software for clinical note transcription with the patient, who gave verbal consent to proceed.  History of Present Illness   Randy Cruz "Randy Cruz" is a 68 year old male who presents for a cardiology follow-up s/p MVR for MVP/MR.  No other Cardiac Issues. On No Meds.  No issues with heart racing, skipping, or flipping. No shortness of breath when lying flat, waking up short of breath, or experiencing leg swelling. No chest pain, pressure, or tightness.  He experiences occasional dizziness upon standing after sitting for a long time. He monitors his blood pressure at home, which is usually around 120 in the morning and 130s during the day, but it rises to 170 when at the urologist's office due to anxiety. No recent illnesses, fevers, chills, colds, sweats, syncope, sudden onset weakness, slurred speech, drooling, or stroke-like symptoms. No sudden changes in vision like a curtain across the eye.  He has a history of stage three arthritis of the hip and was previously prescribed Mobic. He  experienced hematuria after taking Mobic for three weeks, which resolved two days after discontinuing the medication. He is scheduled for a CT scan later this week to investigate the cause of the bleeding.  He has been experiencing abdominal pain for the past three years, with a possible hernia being considered as a cause. The pain is described as different from his hip pain  and is located in the same area. He is still undergoing tests to determine the cause.  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 This condition causes fluctuating vision problems, which have not changed recently.     The visual floaters are not a new finding.  The abdominal pain is also ongoing.    Objective   Not currently on any medications.  Studies Reviewed: Marland Kitchen   EKG Interpretation Date/Time:  Tuesday January 14 2024 08:15:34 EST Ventricular Rate:  67 PR Interval:  214 QRS Duration:  112 QT Interval:  400 QTC Calculation: 422 R Axis:   -35  Text Interpretation: Sinus rhythm with 1st degree A-V block Possible Left atrial enlargement Left axis deviation Incomplete right bundle branch block When compared with ECG of 04-Oct-2021 11:38, Premature atrial complexes are no longer Present PR interval has increased Confirmed by Bryan Lemma ([id number]) on 01/14/2024 8:24:43 AM    Previous Studies: Right and Left Heart Cath (10/04/2021): Angiographically normal coronary arteries.  Large right dominant system.  RHC pressures: Mean RAP 1 mmHg, RV P-EDP 21/42-1 mmHg; PAP-mean 21/4-12 mmHg; PCWP 8 mmHg with large V wave.Marland Kitchen ECHO (11/06/2022): Normal LVEF of 55 to 60%.  No RWMA.  GR 1 DD.  Normal RV.  Mild to moderate LA dilation with mild RA dilation.  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  Mild late MVP of both leaflets.  Mean gradient 2.0 mmHg (average heart rate 66 bpm).  Myxomatous TV.  Ascending aorta measured 40 mmHg.  Stable.  No significant change from previous study of July 2023.  Risk Assessment/Calculations:         Physical Exam:   VS:  BP 138/82 (BP Location: Right Arm, Patient Position: Sitting, Cuff Size: Normal)   Pulse 67   Ht 5\' 9"  (1.753 m)   Wt 155 lb (70.3 kg)   BMI 22.89 kg/m    He notes that his blood pressures at home are usually if anything on the low side in the 110s/70s. Wt Readings from Last 3  Encounters:  01/14/24 155 lb (70.3 kg)  12/14/22 155 lb 6.4 oz (70.5 kg)  06/25/22 150 lb 3.2 oz (68.1 kg)    GEN: Well nourished, well groomed in no acute distress; healthy-appearing.   NECK: No JVD; No carotid bruits CARDIAC: RRR, Normal S1, S2; oft HSM at apex (1/4) otherwise no rubs, gallops RESPIRATORY:  Clear to auscultation without rales, wheezing or rhonchi ; nonlabored, good air movement. ABDOMEN: Soft, non-tender, non-distended EXTREMITIES:  No edema; No deformity      ASSESSMENT AND PLAN: .    Problem List Items Addressed This Visit       Cardiology Problems   Dilatation of thoracic aorta (HCC) (Chronic)   Has been stable on follow-up echocardiograms. This is just likely his normal body size.  We are following his valves with echo and will continue to monitor.      Relevant Orders   EKG 12-Lead (Completed)   ECHOCARDIOGRAM COMPLETE   Hyperlipidemia, unspecified (Chronic)   No labs  since 2022 for me to review lipids.  Recommended continue to follow-up with his PCP.  Would target LDL least less than 100.      Mitral valve posterior leaflet prolapse (Chronic)   Mild residual MVP (postop mitral valve valvuloplasty) on recent echocardiogram but stable.  Barely audible murmur on exam.  Continue to follow-up with the routine echocardiogram      Relevant Orders   EKG 12-Lead (Completed)   ECHOCARDIOGRAM COMPLETE     Other   Elevated blood pressure reading in office without diagnosis of hypertension (Chronic)   Patient reports occasional elevated readings at home and during medical visits, likely due to anxiety. No symptoms of end-organ damage. States that his pressures are usually much better at home. -Advise patient to continue monitoring blood pressure at home and to report any consistently high readings.      S/P MVR (mitral valve repair)-[facility] - Primary (Chronic)   No symptoms of heart failure or valve dysfunction.  Discussed the need for bacterial  endocarditis prophylaxis with invasive procedures and deep dental cleanings due to the presence of a surgical ring. -Schedule echocardiogram for surveillance in December 2025 or January 2026.      Relevant Orders   ECHOCARDIOGRAM COMPLETE    Hematuria Occurred while on Mobic for hip arthritis, resolved after discontinuation of Mobic. CT scan scheduled with urology to investigate cause. -Continue to follow up with urology as planned.  Hip Arthritis Stage 3, previously on Mobic but discontinued due to hematuria. -Continue to follow up with orthopedics as needed for management.      Follow-Up: Return in about 1 year (around 01/13/2025) for Routine follow up with me.     Signed, Marykay Lex, MD, MS Bryan Lemma, M.D., M.S. Interventional Cardiologist  [facility]  Pager # [phone] Phone # [phone] [address] [place], [place] [address]

## 2024-01-14 NOTE — Patient Instructions (Addendum)
Medication Instructions:   No changes *If you need a refill on your cardiac medications before your next appointment, please call your pharmacy*   Lab Work: Not needed    Testing/Procedures:  In Dec 2025  -[facility] Your physician has requested that you have an echocardiogram. Echocardiography is a painless test that uses sound waves to create images of your heart. It provides your doctor with information about the size and shape of your heart and how well your heart's chambers and valves are working. This procedure takes approximately one hour. There are no restrictions for this procedure. Please do NOT wear cologne, perfume, aftershave, or lotions (deodorant is allowed). Please arrive 15 minutes prior to your appointment time.  Please note: We ask at that you not bring children with you during ultrasound (echo/ vascular) testing. Due to room size and safety concerns, children are not allowed in the ultrasound rooms during exams. Our front office staff cannot provide observation of children in our lobby area while testing is being conducted. An adult accompanying a patient to their appointment will only be allowed in the ultrasound room at the discretion of the ultrasound technician under special circumstances. We apologize for any inconvenience.    Follow-Up: At [facility], you and your health needs are our priority.  As part of our continuing mission to provide you with exceptional heart care, we have created designated Provider Care Teams.  These Care Teams include your primary Cardiologist (physician) and Advanced Practice Providers (APPs -  Physician Assistants and Nurse Practitioners) who all work together to provide you with the care you need, when you need it.     Your next appointment:   12 month(s)  The format for your next appointment:   In Person  Provider:   Bryan Lemma, MD    Other Instructions    You will need SBE prophylaxis with dental  work , GI  procedures

## 2024-01-16 ENCOUNTER — Encounter: Payer: Self-pay | Admitting: Cardiology

## 2024-01-16 DIAGNOSIS — R030 Elevated blood-pressure reading, without diagnosis of hypertension: Secondary | ICD-10-CM | POA: Insufficient documentation

## 2024-01-16 NOTE — Assessment & Plan Note (Signed)
Has been stable on follow-up echocardiograms. This is just likely his normal body size.  We are following his valves with echo and will continue to monitor.

## 2024-01-16 NOTE — Assessment & Plan Note (Signed)
Mild residual MVP (postop mitral valve valvuloplasty) on recent echocardiogram but stable.  Barely audible murmur on exam.  Continue to follow-up with the routine echocardiogram

## 2024-01-16 NOTE — Assessment & Plan Note (Signed)
No symptoms of heart failure or valve dysfunction.  Discussed the need for bacterial endocarditis prophylaxis with invasive procedures and deep dental cleanings due to the presence of a surgical ring. -Schedule echocardiogram for surveillance in December 2025 or January 2026.

## 2024-01-16 NOTE — Assessment & Plan Note (Signed)
Patient reports occasional elevated readings at home and during medical visits, likely due to anxiety. No symptoms of end-organ damage. States that his pressures are usually much better at home. -Advise patient to continue monitoring blood pressure at home and to report any consistently high readings.

## 2024-01-16 NOTE — Assessment & Plan Note (Signed)
No labs since 2022 for me to review lipids.  Recommended continue to follow-up with his PCP.  Would target LDL least less than 100.

## 2024-11-12 ENCOUNTER — Ambulatory Visit: Payer: Self-pay | Admitting: Cardiology

## 2024-11-12 ENCOUNTER — Ambulatory Visit (HOSPITAL_COMMUNITY)
Admission: RE | Admit: 2024-11-12 | Discharge: 2024-11-12 | Disposition: A | Discharge status: Home / Self Care | Payer: BC Managed Care – PPO | Source: Ambulatory Visit | Attending: Cardiology | Admitting: Cardiology

## 2024-11-12 DIAGNOSIS — Z9889 Other specified postprocedural states: Secondary | ICD-10-CM | POA: Insufficient documentation

## 2024-11-12 DIAGNOSIS — I341 Nonrheumatic mitral (valve) prolapse: Secondary | ICD-10-CM | POA: Diagnosis present

## 2024-11-12 DIAGNOSIS — I77810 Thoracic aortic ectasia: Secondary | ICD-10-CM | POA: Insufficient documentation

## 2024-11-12 LAB — ECHOCARDIOGRAM COMPLETE
Area-P 1/2: 3.21 cm2
MV VTI: 1.47 cm2
S' Lateral: 3.5 cm

## 2024-12-12 NOTE — Progress Notes (Unsigned)
 " Cardiology Office Note:  .   Date:  12/18/2024  ID:  Randy Cruz, DOB [birth year], MRN [id number] PCP: [name], MD  [facility] Providers Cardiologist:  [name], MD     Chief Complaint  Patient presents with   Follow-up   Cardiac Valve Problem    To discuss echocardiogram results    Patient Profile: .     Randy Cruz is a  69 y.o. male  with a PMH notable for H/Zio MVR (repair) for severe MR/MVP (December 2022 at [facility]) who presents here for planned annual follow-up-earlier than expected to discuss echo results. He follows up at the request of [name], MD.  PMH: Mitral Prolapse/Mitral Regurgitation: Initial Consult With [facility] June 2018 with Dr. Delford: Establishing care for evaluation of mitral prolapse and MR.  Had been previously followed in [place] -with last assessment in 2008 Echo July 2018: Moderate MVP of anterior posterior leaflet.  Mild MR.  Moderate LA dilation.  (Lost to follow-up) Referred for Dilated Thoracic Aorta September 2022: CT scan suggested Thoracic Ascending Aorta measured at 41 mm.. => Follow-up echo ordered to assess significant murmur with known MVP Echo September 2022: EF 65 to 70%.  Mild elevated PAP.  Severe MR with severe prolapse/possible flare of posterior leaflet causing eccentric anterior MR jet.  TEE recommended. TEE 10/04/2021:Severe LA dilation.  This severe flail motion of P2 scallop Post MV leaflet due to ruptured chordae, highly eccentric jet of severe MR directed anteriorly and towards septum.  Also ruptured chordae of the lateral portion of A2.  Severe prolapse of medial third (A3), of anterior leaflet with moderate to severe eccentric MR hugging posterior wall of LA.  Overall regurgitant area/volume well over 100 mL. => Severe MR with severe prolapse of both leaflets.  Also noted myxomatous tricuspid valve. Normal coronaries on cath Referred to [facility] for MVR Mitral Valvuloplasty  (11/16/2021): Radical reconstruction of mitral valve with 38 mm ring; Gore-Tex artificial cords to P2 and A3. Postop A-fib-on amiodarone; followed by atrial flutter converted with DCCV  Randy Cruz was last seen on 01/14/2024 for annual follow-up-3 years out from mitral valve repair surgery.  He was doing well.  Was not on any cardiac medications.  In BP or rhythm.  Blood pressures usually pretty well-controlled and having some issues with orthostatic dizziness.  No chest pain pressure or dyspnea.  Concerned about a possible hernia but otherwise no cardiac symptoms.  He continue to remain on no medications.  We discussed SBE prophylaxis.  Scheduled for surveillance echocardiogram in December 2025 with plan to follow-up now.  Echocardiogram done in December 2025 revealed moderate to severe MR, so he was contacted to ensure that he came in for follow-up.     Subjective  Discussed the use of AI scribe software for clinical note transcription with the patient, who gave verbal consent to proceed. History of Present Illness Randy Cruz is a 69 year old male with a history of mitral valve surgery who presents for follow-up regarding his cardiac health.  He experiences variations in blood pressure readings, typically around 130 mmHg, but occasionally dropping to 104 mmHg. He sometimes feels his heart skips a beat, as observed on his [employer] readings. No heart racing, skipping, flip-flopping, shortness of breath, chest tightness, or pressure.  He has ongoing right hip pain radiating to the right testicle, especially when the bladder is full. The pain is a constant low-level discomfort that intensifies with walking, producing a sensation of  heat. Symptoms exacerbate following COVID vaccinations, with additional shivering, sweating, and vomiting. He recalls a transient episode of left leg numbness after his last COVID shot in October, which resolved within minutes and has not recurred.  He has  reduced his walking from four miles to two miles daily due to arthritis pain, not cardiac symptoms. He has not had recent colonoscopies or dental procedures and is not allergic to penicillin.  He reports occasional dizziness upon standing but denies leg swelling. He has not experienced significant headaches, though he occasionally has 'pinpoint' headaches and visual disturbances.    Objective   Not currently on any medications.  Studies Reviewed: SABRA   EKG Interpretation Date/Time:  Monday December 14 2024 08:14:32 EST Ventricular Rate:  78 PR Interval:  218 QRS Duration:  108 QT Interval:  380 QTC Calculation: 433 R Axis:   -39  Text Interpretation: Sinus rhythm with 1st degree A-V block with frequent Premature ventricular complexes Possible Left atrial enlargement Left axis deviation RSR' or QR pattern in V1 suggests right ventricular conduction delay When compared with ECG of 14-Jan-2024 08:15, Premature ventricular complexes are now Present Confirmed by Anner Lenis ([id number]) on 12/14/2024 8:29:20 AM    Due to get labs checked at PCPs office this week.  Echocardiogram (11/12/2024): Normal LV size and function.  EF 60 to 65%.  No RWMA.  Severe LA dilation.  Moderate RV and RA dilation.  S/p mitral valve repair with moderate to severe eccentrically directed MR.  Recommend TEE for better assessment of MR.  Previous Studies: Right and Left Heart Cath (10/04/2021): Angiographically normal coronary arteries.  Large right dominant system.  RHC pressures: Mean RAP 1 mmHg, RV P-EDP 21/42-1 mmHg; PAP-mean 21/4-12 mmHg; PCWP 8 mmHg with large V wave.. Echocardiogram (11/06/2022): Normal LVEF of 55 to 60%.  No RWMA.  GR 1 DD.  Normal RV.  Mild to moderate LA dilation with mild RA dilation.  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  Mild late MVP of both leaflets.  Mean gradient 2.0 mmHg (average heart rate 66 bpm).  Myxomatous TV.  Ascending aorta measured 40 mmHg.  Stable.  No significant change from previous study  of July 2023.  Risk Assessment/Calculations:     Only postop A-fib but no recurrence.      Physical Exam:   VS:  BP (!) 140/70 (BP Location: Right Arm, Patient Position: Sitting, Cuff Size: Normal)   Pulse 78   Ht 5' 9 (1.753 m)   Wt 154 lb (69.9 kg)   SpO2 98%   BMI 22.74 kg/m    Wt Readings from Last 3 Encounters:  12/14/24 154 lb (69.9 kg)  01/14/24 155 lb (70.3 kg)  12/14/22 155 lb 6.4 oz (70.5 kg)    GEN: Well nourished, well developed in no acute distress; healthy-appearing. NECK: No JVD; No carotid bruits CARDIAC: RRR with ectopy;, Normal S1, S2; 4/6 HSM at apex and throughout.  No rubs, gallops RESPIRATORY:  Clear to auscultation without rales, wheezing or rhonchi ; nonlabored, good air movement. ABDOMEN: Soft, non-tender, non-distended EXTREMITIES:  No edema; No deformity      ASSESSMENT AND PLAN: .    Moderate to severe mitral regurgitation Assessment & Plan: 3 years status post MVR for severe MR/MVP at [facility]. Follow-up echocardiogram shows progression of mitral digitation to now moderate to severe.  Recommendation was to consider TEE -> however in the absence of significant symptoms, we will hold off for now. Condition stable compared to previous studies, asymptomatic, no atrial  fibrillation. Annual monitoring due to changes from prior assessments.  Surgery not indicated unless symptoms develop. - Continue annual echocardiograms to monitor mitral valve function. - Educated on symptoms to watch for, such as dyspnea or palpitations, which may necessitate further evaluation. -Will add bisoprolol  2.5 mg daily for PVC control as well as BP.  Orders: -     EKG 12-Lead  Dilatation of thoracic aorta Assessment & Plan: Would be due for CT scan follow-up next year.  Orders: -     EKG 12-Lead  Elevated blood pressure reading in office without diagnosis of hypertension Assessment & Plan: BP elevated today, significant PVCs noted on EKG. -Will start low-dose  bisoprolol .  He is very reluctant to take medications but is agreeable.  - Prescribed bisoprolol  to manage blood pressure and PVCs, to be taken at night. - Monitor blood pressure and adjust treatment as necessary.  Orders: -     EKG 12-Lead  S/P MVR (mitral valve repair)-[facility] Assessment & Plan: Status post MVR/repair now with tethering and moderate to severe MR but asymptomatic. EF remains preserved and LV size is normal.  Will hold off on further evaluation unless symptoms warrant.  Discussed SBE prophylaxis.  Orders: -     EKG 12-Lead  Asymptomatic PVCs Assessment & Plan: EKG showed asymptomatic, benign PVCs. Frequency not concerning. - Prescribed bisoprolol  to manage PVCs and lower blood pressure, to be taken at night to minimize side effects. - Educated on potential side effects of bisoprolol , including initial tiredness, and reassured about its benefits in managing PVCs.   Mixed hyperlipidemia Assessment & Plan: Labs due to be checked by PCP.  With recommend shooting for an LDL less than 100, but with relatively normal coronaries on preop, no rush.  He is very reluctant to take medications and would likely not take a statin.   Preop cardiovascular exam Assessment & Plan: He may potentially be considering hip arthroplasty because of significant lifestyle limiting hip pain.  Although his echocardiogram does shows moderate to severe MR, this should be no contraindication for proceeding with surgery.  He is not having active angina or heart failure-preop cath showed minimal CAD.  We are starting low-dose beta-blocker which should mitigate some risk.  Hip surgery although a large surgery is low risk for cardiac standpoint, he has normal renal function, no stroke and no other active symptoms.  Therefore he would be a low risk patient for low risk procedure.  The father I would get from this current echocardiogram, if it is closer to 1 year (greater than 8 months) would prefer  to move up follow-up echocardiogram prior to surgery to get the most accurate data.   Other orders -     Bisoprolol  Fumarate; Take 2.5 mg by mouth daily.  Dispense: 90 tablet; Refill: 3    Orders Placed This Encounter  Procedures   EKG 12-Lead          Follow-Up: Return in about 6 months (around 06/13/2025) for 6 month follow-up with me, [employer].  I spent 45 minutes in the care of Jaymarion Trombly today including reviewing labs (1 minute looking for labs.), reviewing outside labs from Care Everywhere/[facility] (included 1 minute), reviewing studies (I personally reviewed the echocardiogram images as well as the report-6 minutes), face to face time discussing treatment options (27 minutes), reviewing records from previous clinic visit (2 minutes), 9 minutes dictating, and documenting in the encounter.      Signed, Alm MICAEL Clay, MD, MS Alm Clay,  M.D., M.S. Interventional Cardiologist  [facility] Pager # [phone]       "

## 2024-12-12 NOTE — Assessment & Plan Note (Signed)
 Follow-up echocardiogram shows progression of mitral digitation to now moderate to severe.  Recommendation was to consider TEE.

## 2024-12-14 ENCOUNTER — Other Ambulatory Visit: Payer: Self-pay | Admitting: Cardiology

## 2024-12-14 ENCOUNTER — Ambulatory Visit: Attending: Cardiology | Admitting: Cardiology

## 2024-12-14 VITALS — BP 140/70 | HR 78 | Ht 69.0 in | Wt 154.0 lb

## 2024-12-14 DIAGNOSIS — I77810 Thoracic aortic ectasia: Secondary | ICD-10-CM

## 2024-12-14 DIAGNOSIS — R030 Elevated blood-pressure reading, without diagnosis of hypertension: Secondary | ICD-10-CM | POA: Diagnosis not present

## 2024-12-14 DIAGNOSIS — Z01810 Encounter for preprocedural cardiovascular examination: Secondary | ICD-10-CM | POA: Diagnosis not present

## 2024-12-14 DIAGNOSIS — E782 Mixed hyperlipidemia: Secondary | ICD-10-CM

## 2024-12-14 DIAGNOSIS — Z9889 Other specified postprocedural states: Secondary | ICD-10-CM

## 2024-12-14 DIAGNOSIS — I340 Nonrheumatic mitral (valve) insufficiency: Secondary | ICD-10-CM | POA: Diagnosis not present

## 2024-12-14 DIAGNOSIS — I493 Ventricular premature depolarization: Secondary | ICD-10-CM | POA: Diagnosis not present

## 2024-12-14 MED ORDER — BISOPROLOL FUMARATE 2.5 MG PO TABS: 2.5000 mg | ORAL_TABLET | Freq: Every day | ORAL | 3 refills | Status: DC

## 2024-12-14 NOTE — Patient Instructions (Addendum)
 Medication Instructions:   Start taking Bisoprolol  2.5 mg  daily  *If you need a refill on your cardiac medications before your next appointment, please call your pharmacy*   Lab Work: Not needed    Testing/Procedures:  Not needed  Follow-Up: At [facility], you and your health needs are our priority.  As part of our continuing mission to provide you with exceptional heart care, we have created designated Provider Care Teams.  These Care Teams include your primary Cardiologist (physician) and Advanced Practice Providers (APPs -  Physician Assistants and Nurse Practitioners) who all work together to provide you with the care you need, when you need it.     Your next appointment:   6 month(s)  The format for your next appointment:   In Person  Provider:   Alm Clay, MD

## 2024-12-17 ENCOUNTER — Encounter: Payer: Self-pay | Admitting: Cardiology

## 2024-12-17 NOTE — Progress Notes (Incomplete)
 " Cardiology Office Note:  .   Date:  12/18/2024  ID:  Randy Cruz, DOB Sep 23, 1956, MRN [id number] PCP: [name], MD  [facility] Providers Cardiologist:  [name], MD { Click to update primary MD,subspecialty MD or APP then REFRESH:1}    Chief Complaint  Patient presents with   Follow-up   Cardiac Valve Problem    To discuss echocardiogram results    Patient Profile: .     Randy Cruz is a  69 y.o. 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 He follows up at the request of [name], MD.  PMH: Mitral Prolapse/Mitral Regurgitation: Initial Consult With [facility] June 2018 with Dr. Delford: Establishing care for evaluation of mitral prolapse and MR.  Had been previously followed in [place] -with last assessment in 2008 Echo July 2018: Moderate MVP of anterior posterior leaflet.  Mild MR.  Moderate LA dilation.  (Lost to follow-up) Referred for Dilated Thoracic Aorta September 2022: CT scan suggested Thoracic Ascending Aorta measured at 41 mm.. => Follow-up echo ordered to assess significant murmur with known MVP Echo September 2022: EF 65 to 70%.  Mild elevated PAP.  Severe MR with severe prolapse/possible flare of posterior leaflet causing eccentric anterior MR jet.  TEE recommended. TEE 10/04/2021:Severe LA dilation.  This severe flail motion of P2 scallop Post MV leaflet due to ruptured chordae, highly eccentric jet of severe MR directed anteriorly and towards septum.  Also ruptured chordae of the lateral portion of A2.  Severe prolapse of medial third (A3), of anterior leaflet with moderate to severe eccentric MR hugging posterior wall of LA.  Overall regurgitant area/volume well over 100 mL. => Severe MR with severe prolapse of both leaflets.  Also noted myxomatous tricuspid valve. Normal coronaries on  cath Referred to [facility] for MVR Mitral Valvuloplasty (11/16/2021): Radical reconstruction of mitral valve with 38 mm ring; Gore-Tex artificial cords to P2 and A3. Postop A-fib-on amiodarone; followed by atrial flutter converted with DCCV  [name] was last seen on 01/14/2024 for annual follow-up-3 years out from mitral valve repair surgery.  He was doing well.  Was not on any cardiac medications.  In BP or rhythm.  Blood pressures usually pretty well-controlled and having some issues with orthostatic dizziness.  No chest pain pressure or dyspnea.  Concerned about a possible hernia but otherwise no cardiac symptoms.  He continue to remain on no medications.  We discussed SBE prophylaxis.  Scheduled for surveillance echocardiogram in December 2025 with plan to follow-up now.  Echocardiogram done in December 2025 revealed moderate to severe MR, so he was contacted to ensure that he came in for follow-up.     Subjective  Discussed the use of AI scribe software for clinical note transcription with the patient, who gave verbal consent to proceed. History of Present Illness Randy Cruz is a 69 year old male with a history of mitral valve surgery who presents for follow-up regarding his cardiac health.  He experiences variations in blood pressure readings, typically around 130 mmHg, but occasionally dropping to 104 mmHg. He sometimes feels his heart skips a beat, as observed on his [employer] readings. No heart racing, skipping, flip-flopping, shortness of breath, chest tightness, or pressure.  He has ongoing right hip pain radiating to the right testicle, especially when the bladder is full. The pain is a constant  low-level discomfort that intensifies with walking, producing a sensation of heat. Symptoms exacerbate following COVID vaccinations, with additional shivering, sweating, and vomiting. He recalls a transient episode of left leg numbness after his last COVID shot in October,  which resolved within minutes and has not recurred.  He has reduced his walking from four miles to two miles daily due to arthritis pain, not cardiac symptoms. He has not had recent colonoscopies or dental procedures and is not allergic to penicillin.  He reports occasional dizziness upon standing but denies leg swelling. He has not experienced significant headaches, though he occasionally has 'pinpoint' headaches and visual disturbances.    Objective   Not currently on any medications.  Studies Reviewed: SABRA   EKG Interpretation Date/Time:  Monday December 14 2024 08:14:32 EST Ventricular Rate:  78 PR Interval:  218 QRS Duration:  108 QT Interval:  380 QTC Calculation: 433 R Axis:   -39  Text Interpretation: Sinus rhythm with 1st degree A-V block with frequent Premature ventricular complexes Possible Left atrial enlargement Left axis deviation RSR' or QR pattern in V1 suggests right ventricular conduction delay When compared with ECG of 14-Jan-2024 08:15, Premature ventricular complexes are now Present Confirmed by Anner Lenis ([id number]) on 12/14/2024 8:29:20 AM    Echocardiogram (11/12/2024): Normal LV size and function.  EF 60 to 65%.  No RWMA.  Severe LA dilation.  Moderate RV and RA dilation.  S/p mitral valve repair with moderate to severe eccentrically directed MR.  Recommend TEE for better assessment of MR.  Previous Studies: Right and Left Heart Cath (10/04/2021): Angiographically normal coronary arteries.  Large right dominant system.  RHC pressures: Mean RAP 1 mmHg, RV P-EDP 21/42-1 mmHg; PAP-mean 21/4-12 mmHg; PCWP 8 mmHg with large V wave.. Echocardiogram (11/06/2022): Normal LVEF of 55 to 60%.  No RWMA.  GR 1 DD.  Normal RV.  Mild to moderate LA dilation with mild RA dilation.  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  Mild late MVP of both leaflets.  Mean gradient 2.0 mmHg (average heart rate 66 bpm).  Myxomatous TV.  Ascending aorta measured 40 mmHg.  Stable.  No significant change from  previous study of July 2023.  Risk Assessment/Calculations:     Only postop A-fib but no recurrence.      Physical Exam:   VS:  BP (!) 140/70 (BP Location: Right Arm, Patient Position: Sitting, Cuff Size: Normal)   Pulse 78   Ht 5' 9 (1.753 m)   Wt 154 lb (69.9 kg)   SpO2 98%   BMI 22.74 kg/m    Wt Readings from Last 3 Encounters:  12/14/24 154 lb (69.9 kg)  01/14/24 155 lb (70.3 kg)  12/14/22 155 lb 6.4 oz (70.5 kg)    GEN: Well nourished, well developed in no acute distress; healthy-appearing. NECK: No JVD; No carotid bruits CARDIAC: RRR with ectopy;, Normal S1, S2; 4/6 HSM at apex and throughout.  No rubs, gallops RESPIRATORY:  Clear to auscultation without rales, wheezing or rhonchi ; nonlabored, good air movement. ABDOMEN: Soft, non-tender, non-distended EXTREMITIES:  No edema; No deformity      ASSESSMENT AND PLAN: .    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 Follow-up echocardiogram shows progression of mitral digitation to now moderate to severe.  Recommendation was to consider TEE -> however in the absence of significant symptoms, we will hold off for now. Condition stable compared to previous studies, asymptomatic, no atrial fibrillation.  Annual monitoring due to changes from prior assessments.  Surgery not indicated unless symptoms develop. - Continue annual echocardiograms to monitor mitral valve function. - Educated on symptoms to watch for, such as dyspnea or palpitations, which may necessitate further evaluation. -Will add bisoprolol  2.5 mg daily for PVC control as well as BP.  Orders: -     EKG 12-Lead  Dilatation of thoracic aorta Assessment & Plan: Would be due for CT scan follow-up next year.  Orders: -     EKG 12-Lead  Elevated blood pressure reading in office without diagnosis of hypertension Assessment & Plan: BP elevated today, significant PVCs noted on EKG. -Will start  low-dose bisoprolol .  He is very reluctant to take medications but is agreeable.  Orders: -     EKG 12-Lead  S/P MVR (mitral valve repair)-[facility] Assessment & Plan: Status post MVR/repair now with tethering and moderate to severe MR but asymptomatic. EF remains preserved and LV size is normal.  Will hold off on further evaluation unless symptoms warrant.  Discussed SBE prophylaxis.  Orders: -     EKG 12-Lead  Asymptomatic PVCs  Other orders -     Bisoprolol  Fumarate; Take 2.5 mg by mouth daily.  Dispense: 90 tablet; Refill: 3    Orders Placed This Encounter  Procedures   EKG 12-Lead   Assessment and Plan Assessment & Plan Moderate to severe mitral regurgitation after mitral valve repair Echocardiogram showed eccentric mitral regurgitation with leaflet tethering.  Asymptomatic premature ventricular contractions (PVCs) EKG showed asymptomatic, benign PVCs. Frequency not concerning. - Prescribed bisoprolol  to manage PVCs and lower blood pressure, to be taken at night to minimize side effects. - Educated on potential side effects of bisoprolol , including initial tiredness, and reassured about its benefits in managing PVCs.  Elevated blood pressure without hypertension Blood pressure readings varied, no hypertension diagnosis. Bisoprolol  prescribed to manage blood pressure and PVCs, aiming for slight reduction without affecting activity. - Prescribed bisoprolol  to manage blood pressure and PVCs, to be taken at night. - Monitor blood pressure and adjust treatment as necessary.  Recording duration: 29 minutes       {Are you ordering a CV Procedure (e.g. stress test, cath, DCCV, TEE, etc)?   Press F2        :[id number]}   Follow-Up: No follow-ups on file.  I spent *** minutes in the care of Maddoxx Burkitt today including reviewing labs (***), reviewing outside labs from *** (***), reviewing studies (I personally reviewed the echocardiogram images as well as the report-6  minutes), face to face time discussing treatment options (***), reviewing records from previous clinic visit (2 minutes), ***, and documenting in the encounter.      Signed, Alm MICAEL Clay, MD, MS Alm Clay, M.D., M.S. Interventional Cardiologist  [facility] Pager # [phone]       "

## 2024-12-17 NOTE — Assessment & Plan Note (Signed)
 Would be due for CT scan follow-up next year.

## 2024-12-18 ENCOUNTER — Telehealth: Payer: Self-pay | Admitting: Cardiology

## 2024-12-18 NOTE — Assessment & Plan Note (Signed)
 EKG showed asymptomatic, benign PVCs. Frequency not concerning. - Prescribed bisoprolol  to manage PVCs and lower blood pressure, to be taken at night to minimize side effects. - Educated on potential side effects of bisoprolol , including initial tiredness, and reassured about its benefits in managing PVCs.

## 2024-12-18 NOTE — Assessment & Plan Note (Addendum)
 BP elevated today, significant PVCs noted on EKG. -Will start low-dose bisoprolol .  He is very reluctant to take medications but is agreeable.  - Prescribed bisoprolol  to manage blood pressure and PVCs, to be taken at night. - Monitor blood pressure and adjust treatment as necessary.

## 2024-12-18 NOTE — Assessment & Plan Note (Signed)
 He may potentially be considering hip arthroplasty because of significant lifestyle limiting hip pain.  Although his echocardiogram does shows moderate to severe MR, this should be no contraindication for proceeding with surgery.  He is not having active angina or heart failure-preop cath showed minimal CAD.  We are starting low-dose beta-blocker which should mitigate some risk.  Hip surgery although a large surgery is low risk for cardiac standpoint, he has normal renal function, no stroke and no other active symptoms.  Therefore he would be a low risk patient for low risk procedure.  The father I would get from this current echocardiogram, if it is closer to 1 year (greater than 8 months) would prefer to move up follow-up echocardiogram prior to surgery to get the most accurate data.

## 2024-12-18 NOTE — Telephone Encounter (Signed)
 Pt c/o medication issue:  1. Name of Medication:  Bisoprolol  Fumarate 2.5 MG TABS  2. How are you currently taking this medication (dosage and times per day)?   3. Are you having a reaction (difficulty breathing--STAT)?   4. What is your medication issue?   Wife says if prescription is written for 2.5 MG it will cost $200+, but 5 MG is only about $14. She would like to have prescription changed if possible.

## 2024-12-18 NOTE — Assessment & Plan Note (Signed)
 Status post MVR/repair now with tethering and moderate to severe MR but asymptomatic. EF remains preserved and LV size is normal.  Will hold off on further evaluation unless symptoms warrant.  Discussed SBE prophylaxis.

## 2024-12-18 NOTE — Assessment & Plan Note (Signed)
 Labs due to be checked by PCP.  With recommend shooting for an LDL less than 100, but with relatively normal coronaries on preop, no rush.  He is very reluctant to take medications and would likely not take a statin.

## 2024-12-20 NOTE — Telephone Encounter (Signed)
 We can change the prescription to 5 mg - 1/2 tablet daily; dispense 45 tabs, 4 refills Alm Clay, MD

## 2024-12-21 MED ORDER — BISOPROLOL FUMARATE 5 MG PO TABS: 2.5000 mg | ORAL_TABLET | Freq: Every day | ORAL | 3 refills | Status: AC

## 2024-12-21 NOTE — Telephone Encounter (Signed)
 Spoke with wife, Beverley, per DPR. Confirmed pharmacy and prescription sent. Closing encounter

## 2024-12-21 NOTE — Addendum Note (Signed)
 Addended by: BETHENA POWELL SAUNDERS on: 12/21/2024 08:38 AM   Modules accepted: Orders

## 2025-05-28 ENCOUNTER — Ambulatory Visit: Admitting: Cardiology
